# Patient Record
Sex: Female | Born: 1985
Health system: Southern US, Community
[De-identification: ages and names within clinical notes are randomized; demographics above are authoritative.]

## PROBLEM LIST (undated history)

## (undated) ENCOUNTER — Inpatient Hospital Stay (HOSPITAL_COMMUNITY): Payer: Self-pay

## (undated) DIAGNOSIS — E669 Obesity, unspecified: Secondary | ICD-10-CM

## (undated) DIAGNOSIS — Z789 Other specified health status: Secondary | ICD-10-CM

## (undated) DIAGNOSIS — Z3490 Encounter for supervision of normal pregnancy, unspecified, unspecified trimester: Secondary | ICD-10-CM

## (undated) DIAGNOSIS — G43909 Migraine, unspecified, not intractable, without status migrainosus: Secondary | ICD-10-CM

## (undated) HISTORY — PX: OTHER SURGICAL HISTORY: SHX169

## (undated) HISTORY — DX: Other specified health status: Z78.9

## (undated) HISTORY — DX: Encounter for supervision of normal pregnancy, unspecified, unspecified trimester: Z34.90

## (undated) HISTORY — PX: TONSILLECTOMY: SUR1361

## (undated) HISTORY — PX: CYST EXCISION: SHX5701

---
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
## 2007-01-15 ENCOUNTER — Emergency Department (HOSPITAL_COMMUNITY): Admission: EM | Admit: 2007-01-15 | Discharge: 2007-01-15 | Payer: Self-pay | Admitting: Emergency Medicine

## 2007-08-12 ENCOUNTER — Emergency Department (HOSPITAL_COMMUNITY): Admission: EM | Admit: 2007-08-12 | Discharge: 2007-08-12 | Payer: Self-pay | Admitting: Emergency Medicine

## 2009-05-14 ENCOUNTER — Emergency Department (HOSPITAL_COMMUNITY): Admission: EM | Admit: 2009-05-14 | Discharge: 2009-05-14 | Payer: Self-pay | Admitting: Emergency Medicine

## 2009-10-04 ENCOUNTER — Emergency Department (HOSPITAL_COMMUNITY): Admission: EM | Admit: 2009-10-04 | Discharge: 2009-10-04 | Payer: Self-pay | Admitting: Emergency Medicine

## 2010-05-30 ENCOUNTER — Emergency Department (HOSPITAL_COMMUNITY)
Admission: EM | Admit: 2010-05-30 | Discharge: 2010-05-31 | Disposition: A | Discharge status: Home / Self Care | Payer: PRIVATE HEALTH INSURANCE | Attending: Emergency Medicine | Admitting: Emergency Medicine

## 2010-05-30 DIAGNOSIS — R220 Localized swelling, mass and lump, head: Secondary | ICD-10-CM | POA: Insufficient documentation

## 2010-05-30 DIAGNOSIS — B370 Candidal stomatitis: Secondary | ICD-10-CM | POA: Insufficient documentation

## 2010-05-30 DIAGNOSIS — J029 Acute pharyngitis, unspecified: Secondary | ICD-10-CM | POA: Insufficient documentation

## 2010-05-30 DIAGNOSIS — K1370 Unspecified lesions of oral mucosa: Secondary | ICD-10-CM | POA: Insufficient documentation

## 2010-12-23 ENCOUNTER — Emergency Department (INDEPENDENT_AMBULATORY_CARE_PROVIDER_SITE_OTHER)
Admission: EM | Admit: 2010-12-23 | Discharge: 2010-12-23 | Disposition: A | Discharge status: Home / Self Care | Payer: PRIVATE HEALTH INSURANCE | Source: Home / Self Care

## 2010-12-23 DIAGNOSIS — B349 Viral infection, unspecified: Secondary | ICD-10-CM

## 2010-12-23 DIAGNOSIS — B9789 Other viral agents as the cause of diseases classified elsewhere: Secondary | ICD-10-CM

## 2010-12-23 MED ORDER — PROMETHAZINE-CODEINE 6.25-10 MG/5ML PO SYRP: ORAL_SOLUTION | ORAL | Status: AC

## 2010-12-23 NOTE — ED Provider Notes (Signed)
History     CSN: [id number] Arrival date & time: 12/23/2010  1:57 PM   None     Chief Complaint  Patient presents with  . Sore Throat    sorethroat, earache and cold symptoms for tow days    (Consider location/radiation/quality/duration/timing/severity/associated sxs/prior treatment) Patient is a 25 y.o. female presenting with pharyngitis and cough. The history is provided by the patient.  Sore Throat This is a new problem. The current episode started 2 days ago. The problem occurs constantly. The problem has not changed since onset.Associated symptoms include headaches. Pertinent negatives include no chest pain, no abdominal pain and no shortness of breath. The symptoms are aggravated by nothing. The symptoms are relieved by nothing.  Cough This is a new problem. The current episode started 2 days ago. The problem occurs every few minutes. The problem has not changed since onset.The cough is non-productive. The maximum temperature recorded prior to her arrival was 101 to 101.9 F. The fever has been present for 1 to 2 days. Associated symptoms include chills, headaches, rhinorrhea, sore throat and myalgias. Pertinent negatives include no chest pain, no ear pain, no shortness of breath and no wheezing. She has tried cough syrup for the symptoms. The treatment provided no relief. Her past medical history does not include asthma.    History reviewed. No pertinent past medical history.  Past Surgical History  Procedure Date  . Tonsillectomy     History reviewed. No pertinent family history.  History  Substance Use Topics  . Smoking status: Current Everyday Smoker -- 0.5 packs/day    Types: Cigarettes  . Smokeless tobacco: Not on file  . Alcohol Use: No    OB History    Grav Para Term Preterm Abortions TAB SAB Ect Mult Living                  Review of Systems  Constitutional: Positive for fever and chills. Negative for fatigue.  HENT: Positive for sore throat and  rhinorrhea. Negative for ear pain, sneezing, postnasal drip and sinus pressure.   Respiratory: Positive for cough. Negative for shortness of breath and wheezing.   Cardiovascular: Negative for chest pain and palpitations.  Gastrointestinal: Negative for abdominal pain.  Musculoskeletal: Positive for myalgias.  Neurological: Positive for headaches.    Allergies  Penicillins  Home Medications   Current Outpatient Rx  Name Route Sig Dispense Refill  . PROMETHAZINE-CODEINE 6.25-10 MG/5ML PO SYRP  1-2 tsp every 6 hrs prn cough 120 mL 0    BP 128/77  Pulse 95  Temp(Src) 98.5 F (36.9 C) (Oral)  Resp 18  SpO2 98%  LMP 12/15/2010  Physical Exam  Nursing note and vitals reviewed. Constitutional: She appears well-developed and well-nourished. No distress.  HENT:  Head: Normocephalic and atraumatic.  Right Ear: Tympanic membrane, external ear and ear canal normal.  Left Ear: Tympanic membrane, external ear and ear canal normal.  Nose: Nose normal.  Mouth/Throat: Uvula is midline, oropharynx is clear and moist and mucous membranes are normal. No oropharyngeal exudate, posterior oropharyngeal edema or posterior oropharyngeal erythema.  Neck: Neck supple.  Cardiovascular: Normal rate, regular rhythm and normal heart sounds.   Pulmonary/Chest: Effort normal and breath sounds normal. No respiratory distress.  Abdominal: Soft. She exhibits no distension and no mass. There is no tenderness.  Lymphadenopathy:    She has no cervical adenopathy.  Neurological: She is alert.  Skin: Skin is warm and dry.  Psychiatric: She has a normal mood and affect.  ED Course  Procedures (including critical care time)  Labs Reviewed - No data to display No results found.   1. Viral infection       MDM          Melody Comas, PA 12/23/10 1502

## 2010-12-29 NOTE — ED Provider Notes (Signed)
Medical screening examination/treatment/procedure(s) were performed by non-physician practitioner and as supervising physician I was immediately available for consultation/collaboration.  Luiz Blare MD   Luiz Blare, MD 12/29/10 [phone]

## 2012-07-22 LAB — OB RESULTS CONSOLE HIV ANTIBODY (ROUTINE TESTING): HIV: NONREACTIVE

## 2012-07-22 LAB — OB RESULTS CONSOLE RUBELLA ANTIBODY, IGM: Rubella: UNDETERMINED

## 2012-07-22 LAB — SICKLE CELL SCREEN: Sickle Cell Screen: NEGATIVE

## 2012-07-22 LAB — OB RESULTS CONSOLE ABO/RH: RH Type: POSITIVE

## 2012-07-22 LAB — OB RESULTS CONSOLE RPR: RPR: NONREACTIVE

## 2012-07-22 LAB — OB RESULTS CONSOLE GBS: GBS: NEGATIVE

## 2012-07-22 LAB — OB RESULTS CONSOLE PLATELET COUNT: Platelets: 347 10*3/uL

## 2012-09-25 LAB — CULTURE, OB URINE: Urine Culture, OB: 60000

## 2012-10-22 ENCOUNTER — Encounter: Payer: Self-pay | Admitting: *Deleted

## 2012-11-13 ENCOUNTER — Ambulatory Visit (INDEPENDENT_AMBULATORY_CARE_PROVIDER_SITE_OTHER): Payer: Medicaid Other | Admitting: Obstetrics & Gynecology

## 2012-11-13 ENCOUNTER — Encounter: Payer: Self-pay | Admitting: Obstetrics & Gynecology

## 2012-11-13 VITALS — BP 120/81 | Temp 97.0°F | Ht 70.0 in | Wt 285.6 lb

## 2012-11-13 DIAGNOSIS — Z3480 Encounter for supervision of other normal pregnancy, unspecified trimester: Secondary | ICD-10-CM

## 2012-11-13 DIAGNOSIS — O0990 Supervision of high risk pregnancy, unspecified, unspecified trimester: Secondary | ICD-10-CM | POA: Insufficient documentation

## 2012-11-13 DIAGNOSIS — Z3492 Encounter for supervision of normal pregnancy, unspecified, second trimester: Secondary | ICD-10-CM

## 2012-11-13 DIAGNOSIS — Z3490 Encounter for supervision of normal pregnancy, unspecified, unspecified trimester: Secondary | ICD-10-CM

## 2012-11-13 HISTORY — DX: Encounter for supervision of normal pregnancy, unspecified, unspecified trimester: Z34.90

## 2012-11-13 LAB — POCT URINALYSIS DIP (DEVICE)
Glucose, UA: NEGATIVE mg/dL
Nitrite: NEGATIVE
Protein, ur: NEGATIVE mg/dL
Urobilinogen, UA: 0.2 mg/dL (ref 0.0–1.0)

## 2012-11-13 NOTE — Progress Notes (Signed)
Transfer of care from [facility] (Dr. Rana Snare).  Will obtain records regarding labs, ultrasounds and pap smear.  No other complaints or concerns.  Fetal movement and labor precautions reviewed.  Third trimester labs and Tdap vaccine next visit.

## 2012-11-13 NOTE — Progress Notes (Signed)
P= 89 Pt. 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

## 2012-11-13 NOTE — Patient Instructions (Addendum)
Pregnancy - Third Trimester The third trimester of pregnancy (the last 3 months) is a period of the most rapid growth for you and your baby. The baby approaches a length of 20 inches and a weight of 6 to 10 pounds. The baby is adding on fat and getting ready for life outside your body. While inside, babies have periods of sleeping and waking, sucking thumbs, and hiccuping. You can often feel small contractions of the uterus. This is false labor. It is also called Braxton-Hicks contractions. This is like a practice for labor. The usual problems in this stage of pregnancy include more difficulty breathing, swelling of the hands and feet from water retention, and having to urinate more often because of the uterus and baby pressing on your bladder.  PRENATAL EXAMS  Blood work may continue to be done during prenatal exams. These tests are done to check on your health and the probable health of your baby. Blood work is used to follow your blood levels (hemoglobin). Anemia (low hemoglobin) is common during pregnancy. Iron and vitamins are given to help prevent this. You may also continue to be checked for diabetes. Some of the past blood tests may be done again.  The size of the uterus is measured during each visit. This makes sure your baby is growing properly according to your pregnancy dates.  Your blood pressure is checked every prenatal visit. This is to make sure you are not getting toxemia.  Your urine is checked every prenatal visit for infection, diabetes, and protein.  Your weight is checked at each visit. This is done to make sure gains are happening at the suggested rate and that you and your baby are growing normally.  Sometimes, an ultrasound is performed to confirm the position and the proper growth and development of the baby. This is a test done that bounces harmless sound waves off the baby so your caregiver can more accurately determine a due date.  Discuss the type of pain medicine and  anesthesia you will have during your labor and delivery.  Discuss the possibility and anesthesia if a cesarean section might be necessary.  Inform your caregiver if there is any mental or physical violence at home. Sometimes, a specialized non-stress test, contraction stress test, and biophysical profile are done to make sure the baby is not having a problem. Checking the amniotic fluid surrounding the baby is called an amniocentesis. The amniotic fluid is removed by sticking a needle into the belly (abdomen). This is sometimes done near the end of pregnancy if an early delivery is required. In this case, it is done to help make sure the baby's lungs are mature enough for the baby to live outside of the womb. If the lungs are not mature and it is unsafe to deliver the baby, an injection of cortisone medicine is given to the mother 1 to 2 days before the delivery. This helps the baby's lungs mature and makes it safer to deliver the baby. CHANGES OCCURING IN THE THIRD TRIMESTER OF PREGNANCY Your body goes through many changes during pregnancy. They vary from person to person. Talk to your caregiver about changes you notice and are concerned about.  During the last trimester, you have probably had an increase in your appetite. It is normal to have cravings for certain foods. This varies from person to person and pregnancy to pregnancy.  You may begin to get stretch marks on your hips, abdomen, and breasts. These are normal changes in the body   during pregnancy. There are no exercises or medicines to take which prevent this change.  Constipation may be treated with a stool softener or adding bulk to your diet. Drinking lots of fluids, fiber in vegetables, fruits, and whole grains are helpful.  Exercising is also helpful. If you have been very active up until your pregnancy, most of these activities can be continued during your pregnancy. If you have been less active, it is helpful to start an exercise  program such as walking. Consult your caregiver before starting exercise programs.  Avoid all smoking, alcohol, non-prescribed drugs, herbs and "street drugs" during your pregnancy. These chemicals affect the formation and growth of the baby. Avoid chemicals throughout the pregnancy to ensure the delivery of a healthy infant.  Backache, varicose veins, and hemorrhoids may develop or get worse.  You will tire more easily in the third trimester, which is normal.  The baby's movements may be stronger and more often.  You may become short of breath easily.  Your belly button may stick out.  A yellow discharge may leak from your breasts called colostrum.  You may have a bloody mucus discharge. This usually occurs a few days to a week before labor begins. HOME CARE INSTRUCTIONS   Keep your caregiver's appointments. Follow your caregiver's instructions regarding medicine use, exercise, and diet.  During pregnancy, you are providing food for you and your baby. Continue to eat regular, well-balanced meals. Choose foods such as meat, fish, milk and other low fat dairy products, vegetables, fruits, and whole-grain breads and cereals. Your caregiver will tell you of the ideal weight gain.  A physical sexual relationship may be continued throughout pregnancy if there are no other problems such as early (premature) leaking of amniotic fluid from the membranes, vaginal bleeding, or belly (abdominal) pain.  Exercise regularly if there are no restrictions. Check with your caregiver if you are unsure of the safety of your exercises. Greater weight gain will occur in the last 2 trimesters of pregnancy. Exercising helps:  Control your weight.  Get you in shape for labor and delivery.  You lose weight after you deliver.  Rest a lot with legs elevated, or as needed for leg cramps or low back pain.  Wear a good support or jogging bra for breast tenderness during pregnancy. This may help if worn during  sleep. Pads or tissues may be used in the bra if you are leaking colostrum.  Do not use hot tubs, steam rooms, or saunas.  Wear your seat belt when driving. This protects you and your baby if you are in an accident.  Avoid raw meat, cat litter boxes and soil used by cats. These carry germs that can cause birth defects in the baby.  It is easier to leak urine during pregnancy. Tightening up and strengthening the pelvic muscles will help with this problem. You can practice stopping your urination while you are going to the bathroom. These are the same muscles you need to strengthen. It is also the muscles you would use if you were trying to stop from passing gas. You can practice tightening these muscles up 10 times a set and repeating this about 3 times per day. Once you know what muscles to tighten up, do not perform these exercises during urination. It is more likely to cause an infection by backing up the urine.  Ask for help if you have financial, counseling, or nutritional needs during pregnancy. Your caregiver will be able to offer counseling for these   needs as well as refer you for other special needs.  Make a list of emergency phone numbers and have them available.  Plan on getting help from family or friends when you go home from the hospital.  Make a trial run to the hospital.  Take prenatal classes with the father to understand, practice, and ask questions about the labor and delivery.  Prepare the baby's room or nursery.  Do not travel out of the city unless it is absolutely necessary and with the advice of your caregiver.  Wear only low or no heal shoes to have better balance and prevent falling. MEDICINES AND DRUG USE IN PREGNANCY  Take prenatal vitamins as directed. The vitamin should contain 1 milligram of folic acid. Keep all vitamins out of reach of children. Only a couple vitamins or tablets containing iron may be fatal to a baby or young child when ingested.  Avoid use  of all medicines, including herbs, over-the-counter medicines, not prescribed or suggested by your caregiver. Only take over-the-counter or prescription medicines for pain, discomfort, or fever as directed by your caregiver. Do not use aspirin, ibuprofen or naproxen unless approved by your caregiver.  Let your caregiver also know about herbs you may be using.  Alcohol is related to a number of birth defects. This includes fetal alcohol syndrome. All alcohol, in any form, should be avoided completely. Smoking will cause low birth rate and premature babies.  Illegal drugs are very harmful to the baby. They are absolutely forbidden. A baby born to an addicted mother will be addicted at birth. The baby will go through the same withdrawal an adult does. SEEK MEDICAL CARE IF: You have any concerns or worries during your pregnancy. It is better to call with your questions if you feel they cannot wait, rather than worry about them. SEEK IMMEDIATE MEDICAL CARE IF:   An unexplained oral temperature above 102 F (38.9 C) develops, or as your caregiver suggests.  You have leaking of fluid from the vagina. If leaking membranes are suspected, take your temperature and tell your caregiver of this when you call.  There is vaginal spotting, bleeding or passing clots. Tell your caregiver of the amount and how many pads are used.  You develop a bad smelling vaginal discharge with a change in the color from clear to white.  You develop vomiting that lasts more than 24 hours.  You develop chills or fever.  You develop shortness of breath.  You develop burning on urination.  You loose more than 2 pounds of weight or gain more than 2 pounds of weight or as suggested by your caregiver.  You notice sudden swelling of your face, hands, and feet or legs.  You develop belly (abdominal) pain. Round ligament discomfort is a common non-cancerous (benign) cause of abdominal pain in pregnancy. Your caregiver still  must evaluate you.  You develop a severe headache that does not go away.  You develop visual problems, blurred or double vision.  If you have not felt your baby move for more than 1 hour. If you think the baby is not moving as much as usual, eat something with sugar in it and lie down on your left side for an hour. The baby should move at least 4 to 5 times per hour. Call right away if your baby moves less than that.  You fall, are in a car accident, or any kind of trauma.  There is mental or physical violence at home. Document Released: 01/02/2001   Document Revised: 10/03/2011 Document Reviewed: 07/07/2008 [employer] Patient Information 2014 [employer].  Breastfeeding A change in hormones during your pregnancy causes growth of your breast tissue and an increase in number and size of milk ducts. The hormone prolactin allows proteins, sugars, and fats from your blood supply to make breast milk in your milk-producing glands. The hormone progesterone prevents breast milk from being released before the birth of your baby. After the birth of your baby, your progesterone level decreases allowing breast milk to be released. Thoughts of your baby, as well as his or her sucking or crying, can stimulate the release of milk from the milk-producing glands. Deciding to breastfeed (nurse) is one of the best choices you can make for you and your baby. The information that follows gives a brief review of the benefits, as well as other important skills to know about breastfeeding. BENEFITS OF BREASTFEEDING For your baby  The first milk (colostrum) helps your baby's digestive system function better.   There are antibodies in your milk that help your baby fight off infections.   Your baby has a lower incidence of asthma, allergies, and sudden infant death syndrome (SIDS).   The nutrients in breast milk are better for your baby than infant formulas.  Breast milk improves your baby's brain development.    Your baby will have less gas, colic, and constipation.  Your baby is less likely to develop other conditions, such as childhood obesity, asthma, or diabetes mellitus. For you  Breastfeeding helps develop a very special bond between you and your baby.   Breastfeeding is convenient, always available at the correct temperature, and costs nothing.   Breastfeeding helps to burn calories and helps you lose the weight gained during pregnancy.   Breastfeeding makes your uterus contract back down to normal size faster and slows bleeding following delivery.   Breastfeeding mothers have a lower risk of developing osteoporosis or breast or ovarian cancer later in life.  BREASTFEEDING FREQUENCY  A healthy, full-term baby may breastfeed as often as every hour or space his or her feedings to every 3 hours. Breastfeeding frequency will vary from baby to baby.   Newborns should be fed no less than every 2 3 hours during the day and every 4 5 hours during the night. You should breastfeed a minimum of 8 feedings in a 24 hour period.  Awaken your baby to breastfeed if it has been 3 4 hours since the last feeding.  Breastfeed when you feel the need to reduce the fullness of your breasts or when your newborn shows signs of hunger. Signs that your baby may be hungry include:  Increased alertness or activity.  Stretching.  Movement of the head from side to side.  Movement of the head and opening of the mouth when the corner of the mouth or cheek is stroked (rooting).  Increased sucking sounds, smacking lips, cooing, sighing, or squeaking.  Hand-to-mouth movements.  Increased sucking of fingers or hands.  Fussing.  Intermittent crying.  Signs of extreme hunger will require calming and consoling before you try to feed your baby. Signs of extreme hunger may include:  Restlessness.  A loud, strong cry.  Screaming.  Frequent feeding will help you make more milk and will help prevent  problems, such as sore nipples and engorgement of the breasts.  BREASTFEEDING   Whether lying down or sitting, be sure that the baby's abdomen is facing your abdomen.   Support your breast with 4 fingers under your breast   and your thumb above your nipple. Make sure your fingers are well away from your nipple and your baby's mouth.   Stroke your baby's lips gently with your finger or nipple.   When your baby's mouth is open wide enough, place all of your nipple and as much of the colored area around your nipple (areola) as possible into your baby's mouth.  More areola should be visible above his or her upper lip than below his or her lower lip.  Your baby's tongue should be between his or her lower gum and your breast.  Ensure that your baby's mouth is correctly positioned around the nipple (latched). Your baby's lips should create a seal on your breast.  Signs that your baby has effectively latched onto your nipple include:  Tugging or sucking without pain.  Swallowing heard between sucks.  Absent click or smacking sound.  Muscle movement above and in front of his or her ears with sucking.  Your baby must suck about 2 3 minutes in order to get your milk. Allow your baby to feed on each breast as long as he or she wants. Nurse your baby until he or she unlatches or falls asleep at the first breast, then offer the second breast.  Signs that your baby is full and satisfied include:  A gradual decrease in the number of sucks or complete cessation of sucking.  Falling asleep.  Extension or relaxation of his or her body.  Retention of a small amount of milk in his or her mouth.  Letting go of your breast by himself or herself.  Signs of effective breastfeeding in you include:  Breasts that have increased firmness, weight, and size prior to feeding.  Breasts that are softer after nursing.  Increased milk volume, as well as a change in milk consistency and color by the 5th  day of breastfeeding.  Breast fullness relieved by breastfeeding.  Nipples are not sore, cracked, or bleeding.  If needed, break the suction by putting your finger into the corner of your baby's mouth and sliding your finger between his or her gums. Then, remove your breast from his or her mouth.  It is common for babies to spit up a small amount after a feeding.  Babies often swallow air during feeding. This can make babies fussy. Burping your baby between breasts can help with this.  Vitamin D supplements are recommended for babies who get only breast milk.  Avoid using a pacifier during your baby's first 4 6 weeks.  Avoid supplemental feedings of water, formula, or juice in place of breastfeeding. Breast milk is all the food your baby needs. It is not necessary for your baby to have water or formula. Your breasts will make more milk if supplemental feedings are avoided during the early weeks. HOW TO TELL WHETHER YOUR BABY IS GETTING ENOUGH BREAST MILK Wondering whether or not your baby is getting enough milk is a common concern among mothers. You can be assured that your baby is getting enough milk if:   Your baby is actively sucking and you hear swallowing.   Your baby seems relaxed and satisfied after a feeding.   Your baby nurses at least 8 12 times in a 24 hour time period.  During the first 3 5 days of age:  Your baby is wetting at least 3 5 diapers in a 24 hour period. The urine should be clear and pale yellow.  Your baby is having at least 3 4 stools in   a 24 hour period. The stool should be soft and yellow.  At 5 7 days of age, your baby is having at least 3 6 stools in a 24 hour period. The stool should be seedy and yellow by 5 days of age.  Your baby has a weight loss less than 7 10% during the first 3 days of age.  Your baby does not lose weight after 3 7 days of age.  Your baby gains 4 7 ounces each week after he or she is 4 days of age.  Your baby gains weight  by 5 days of age and is back to birth weight within 2 weeks. ENGORGEMENT In the first week after your baby is born, you may experience extremely full breasts (engorgement). When engorged, your breasts may feel heavy, warm, or tender to the touch. Engorgement peaks within 24 48 hours after delivery of your baby.  Engorgement may be reduced by:  Continuing to breastfeed.  Increasing the frequency of breastfeeding.  Taking warm showers or applying warm, moist heat to your breasts just before each feeding. This increases circulation and helps the milk flow.   Gently massaging your breast before and during the feedings. With your fingertips, massage from your chest wall towards your nipple in a circular motion.   Ensuring that your baby empties at least one breast at every feeding. It also helps to start the next feeding on the opposite breast.   Expressing breast milk by hand or by using a breast pump to empty the breasts if your baby is sleepy, or not nursing well. You may also want to express milk if you are returning to work oryou feel you are getting engorged.  Ensuring your baby is latched on and positioned properly while breastfeeding. If you follow these suggestions, your engorgement should improve in 24 48 hours. If you are still experiencing difficulty, call your lactation consultant or caregiver.  CARING FOR YOURSELF Take care of your breasts.  Bathe or shower daily.   Avoid using soap on your nipples.   Wear a supportive bra. Avoid wearing underwire style bras.  Air dry your nipples for a 3 4minutes after each feeding.   Use only cotton bra pads to absorb breast milk leakage. Leaking of breast milk between feedings is normal.   Use only pure lanolin on your nipples after nursing. You do not need to wash it off before feeding your baby again. Another option is to express a few drops of breast milk and gently massage that milk into your nipples.  Continue breast  self-awareness checks. Take care of yourself.  Eat healthy foods. Alternate 3 meals with 3 snacks.  Avoid foods that you notice affect your baby in a bad way.  Drink milk, fruit juice, and water to satisfy your thirst (about 8 glasses a day).   Rest often, relax, and take your prenatal vitamins to prevent fatigue, stress, and anemia.  Avoid chewing and smoking tobacco.  Avoid alcohol and drug use.  Take over-the-counter and prescribed medicine only as directed by your caregiver or pharmacist. You should always check with your caregiver or pharmacist before taking any new medicine, vitamin, or herbal supplement.  Know that pregnancy is possible while breastfeeding. If desired, talk to your caregiver about family planning and safe birth control methods that may be used while breastfeeding. SEEK MEDICAL CARE IF:   You feel like you want to stop breastfeeding or have become frustrated with breastfeeding.  You have painful breasts or nipples.    Your nipples are cracked or bleeding.  Your breasts are red, tender, or warm.  You have a swollen area on either breast.  You have a fever or chills.  You have nausea or vomiting.  You have drainage from your nipples.  Your breasts do not become full before feedings by the 5th day after delivery.  You feel sad and depressed.  Your baby is too sleepy to eat well.  Your baby is having trouble sleeping.   Your baby is wetting less than 3 diapers in a 24 hour period.  Your baby has less than 3 stools in a 24 hour period.  Your baby's skin or the white part of his or her eyes becomes more yellow.   Your baby is not gaining weight by 54 days of age. MAKE SURE YOU:   Understand these instructions.  Will watch your condition.  Will get help right away if you are not doing well or get worse. Document Released: 01/08/2005 Document Revised: 10/03/2011 Document Reviewed: 08/15/2011 [facility] Patient Information 2014 [place],  [place]. Tetanus, Diphtheria (Td) or Tetanus, Diphtheria, Pertussis (Tdap) Vaccine What You Need to Know WHY GET VACCINATED? Tetanus, diphtheria and pertussis can be very serious diseases. TETANUS (Lockjaw) causes painful muscle spasms and stiffness, usually all over the body.  Tetanus can lead to tightening of muscles in the head and neck so the victim cannot open his mouth or swallow, or sometimes even breathe. Tetanus kills about 1 out of 5 people who are infected. DIPHTHERIA can cause a thick membrane to cover the back of the throat.  Diphtheria can lead to breathing problems, paralysis, heart failure, and even death. PERTUSSIS (Whooping Cough) causes severe coughing spells which can lead to difficulty breathing, vomiting, and disturbed sleep.  Pertussis can lead to weight loss, incontinence, rib fractures and passing out from violent coughing. Up to 2 in 100 adolescents and 5 in 100 adults with pertussis are hospitalized or have complications, including pneumonia and death. These 3 diseases are all caused by bacteria. Diphtheria and pertussis are spread from person to person. Tetanus enters the body through cuts, scratches, or wounds. The [place] saw as many as 200,000 cases a year of diphtheria and pertussis before vaccines were available, and hundreds of cases of tetanus. Since then, tetanus and diphtheria cases have dropped by about 99% and pertussis cases by about 92%. Children 58 years of age and younger get DTaP vaccine to protect them from these three diseases. But older children, adolescents, and adults need protection too. VACCINES FOR ADOLESCENTS AND ADULTS: TD AND TDAP Two vaccines are available to protect people 19 years of age and older from these diseases:  Td vaccine has been used for many years. It protects against tetanus and diphtheria.  Tdap vaccine was licensed in 2005. It is the first vaccine for adolescents and adults that protects against pertussis as well as tetanus  and diphtheria. A Td booster dose is recommended every 10 years. Tdap is given only once. WHICH VACCINE, AND WHEN? Ages 7 through 18 years  A dose of Tdap is recommended at age 61 or 20. This dose could be given as early as age 55 for children who missed one or more childhood doses of DTaP.  Children and adolescents who did not get a complete series of DTaP shots by age 41 should complete the series using a combination of Td and Tdap. Age 89 years and Older  All adults should get a booster dose of Td every 10 years.  Adults under 65 who have never gotten Tdap should get a dose of Tdap as their next booster dose. Adults 65 and older may get one booster dose of Tdap.  Adults (including women who may become pregnant and adults 38 and older) who expect to have close contact with a baby younger than 2 months of age should get a dose of Tdap to help protect the baby from pertussis.  Healthcare professionals who have direct patient contact in hospitals or clinics should get one dose of Tdap. Protection After a Wound  A person who gets a severe cut or burn might need a dose of Td or Tdap to prevent tetanus infection. Tdap should be used for anyone who has never had a dose previously. Td should be used if Tdap is not available, or for:  Anybody who has already had a dose of Tdap.  Children 7 through 49 years of age who completed the childhood DTaP series.  Adults 65 and older. Pregnant Women  Pregnant women who have never had a dose of Tdap should get one, after the 20th week of gestation and preferably during the 3rd trimester. If they do not get Tdap during their pregnancy they should get a dose as soon as possible after delivery. Pregnant women who have previously received Tdap and need tetanus or diphtheria vaccine while pregnant should get Td. Tdap and Td may be given at the same time as other vaccines. SOME PEOPLE SHOULD NOT BE VACCINATED OR SHOULD WAIT  Anyone who has had a life-threatening  allergic reaction after a dose of any tetanus, diphtheria, or pertussis containing vaccine should not get Td or Tdap.  Anyone who has a severe allergy to any component of a vaccine should not get that vaccine. Tell your doctor if the person getting the vaccine has any severe allergies.  Anyone who had a coma, or long or multiple seizures within 7 days after a dose of DTP or DTaP should not get Tdap, unless a cause other than the vaccine was found. These people may get Td.  Talk to your doctor if the person getting either vaccine:  Has epilepsy or another nervous system problem.  Had severe swelling or severe pain after a previous dose of DTP, DTaP, DT, Td, or Tdap vaccine.  Has had Guillain Barr Syndrome (GBS). Anyone who has a moderate or severe illness on the day the shot is scheduled should usually wait until they recover before getting Tdap or Td vaccine. A person with a mild illness or low fever can usually be vaccinated. WHAT ARE THE RISKS FROM TDAP AND TD VACCINES? With a vaccine, as with any medicine, there is always a small risk of a life-threatening allergic reaction or other serious problem. Brief fainting spells and related symptoms (such as jerking movements) can happen after any medical procedure, including vaccination. Sitting or lying down for about 15 minutes after a vaccination can help prevent fainting and injuries caused by falls. Tell your doctor if the patient feels dizzy or lightheaded, or has vision changes or ringing in the ears. Getting tetanus, diphtheria, or pertussis would be much more likely to lead to severe problems than getting either Td or Tdap vaccine. Problems reported after Td and Tdap vaccines are listed below. Mild Problems (noticeable, but did not interfere with activities) Tdap  Pain (about 3 in 4 adolescents and 2 in 3 adults).  Redness or swelling (about 1 in 5).  Mild fever of at least 100.4 F (38 C) (up to  about 1 in 25 adolescents and 1 in  100 adults).  Headache (about 4 in 10 adolescents and 3 in 10 adults).  Tiredness (about 1 in 3 adolescents and 1 in 4 adults).  Nausea, vomiting, diarrhea, or stomach ache (up to 1 in 4 adolescents and 1 in 10 adults).  Chills, body aches, sore joints, rash, or swollen glands (uncommon). Td  Pain (up to about 8 in 10).  Redness or swelling at the injection site (up to about 1 in 3).  Mild fever (up to about 1 in 15).  Headache or tiredness (uncommon). Moderate Problems (interfered with activities, but did not require medical attention) Tdap  Pain at the injection site (about 1 in 20 adolescents and 1 in 100 adults).  Redness or swelling at the injection site (up to about 1 in 16 adolescents and 1 in 25 adults).  Fever over 102 F (38.9 C) (about 1 in 100 adolescents and 1 in 250 adults).  Headache (1 in 300).  Nausea, vomiting, diarrhea, or stomach ache (up to 3 in 100 adolescents and 1 in 100 adults). Td  Fever over 102 F (38.9 C) (rare). Tdap or Td  Extensive swelling of the arm where the shot was given (up to about 3 in 100). Severe Problems (Unable to perform usual activities; required medical attention) Tdap or Td  Swelling, severe pain, bleeding, and redness in the arm where the shot was given (rare). A severe allergic reaction could occur after any vaccine. They are estimated to occur less than once in a million doses. WHAT IF THERE IS A SEVERE REACTION? What should I look for? Any unusual condition, such as a severe allergic reaction or a high fever. If a severe allergic reaction occurred, it would be within a few minutes to an hour after the shot. Signs of a serious allergic reaction can include difficulty breathing, weakness, hoarseness or wheezing, a fast heartbeat, hives, dizziness, paleness, or swelling of the throat. What should I do?  Call a doctor, or get the person to a doctor right away.  Tell your doctor what happened, the date and time it  happened, and when the vaccination was given.  Ask your provider to report the reaction by filing a Vaccine Adverse Event Reporting System (VAERS) form. Or, you can file this report through the VAERS website at www.vaers.LAgents.no or by calling 1-6362988930. VAERS does not provide medical advice. THE NATIONAL VACCINE INJURY COMPENSATION PROGRAM The National Vaccine Injury Compensation Program (VICP) was created in 1986. Persons who believe they may have been injured by a vaccine can learn about the program and about filing a claim by calling 1-203-656-7868 or visiting the VICP website at SpiritualWord.at. HOW CAN I LEARN MORE?  Your doctor can give you the vaccine package insert or suggest other sources of information.  Call your local or state health department.  Contact the Centers for Disease Control and Prevention (CDC):  Call 618-407-8654 (1-800-CDC-INFO).  Visit the [facility] website at PicCapture.uy. CDC Td and Tdap Interim VIS (02/14/10) Document Released: 11/05/2005 Document Revised: 04/02/2011 Document Reviewed: 02/14/2010 ExitCare Patient Information 2014 [place], [place].

## 2012-11-13 NOTE — Addendum Note (Signed)
Addended by: Franchot Mimes on: 11/13/2012 02:39 PM   Modules accepted: Orders

## 2012-11-14 LAB — PRESCRIPTION MONITORING PROFILE (19 PANEL)
Amphetamine/Meth: NEGATIVE ng/mL
Buprenorphine, Urine: NEGATIVE ng/mL
Cannabinoid Scrn, Ur: NEGATIVE ng/mL
Meperidine, Ur: NEGATIVE ng/mL
Nitrites, Initial: NEGATIVE ug/mL
Phencyclidine, Ur: NEGATIVE ng/mL
Tramadol Scrn, Ur: NEGATIVE ng/mL
pH, Initial: 7.6 pH (ref 4.5–8.9)

## 2012-11-14 LAB — ALCOHOL METABOLITE (ETG), URINE: Ethyl Glucuronide (EtG): NEGATIVE ng/mL

## 2012-11-27 ENCOUNTER — Other Ambulatory Visit: Payer: Self-pay

## 2012-12-04 ENCOUNTER — Ambulatory Visit (INDEPENDENT_AMBULATORY_CARE_PROVIDER_SITE_OTHER): Payer: Medicaid Other | Admitting: Family

## 2012-12-04 VITALS — BP 125/89 | Temp 97.2°F | Wt 287.4 lb

## 2012-12-04 DIAGNOSIS — Z23 Encounter for immunization: Secondary | ICD-10-CM

## 2012-12-04 DIAGNOSIS — R8271 Bacteriuria: Secondary | ICD-10-CM

## 2012-12-04 DIAGNOSIS — Z3492 Encounter for supervision of normal pregnancy, unspecified, second trimester: Secondary | ICD-10-CM

## 2012-12-04 LAB — CBC
HCT: 33.1 % — ABNORMAL LOW (ref 36.0–46.0)
Platelets: 321 10*3/uL (ref 150–400)
WBC: 8.7 10*3/uL (ref 4.0–10.5)

## 2012-12-04 LAB — POCT URINALYSIS DIP (DEVICE)
Hgb urine dipstick: NEGATIVE
Ketones, ur: NEGATIVE mg/dL
Nitrite: NEGATIVE
pH: 7 (ref 5.0–8.0)

## 2012-12-04 MED ORDER — TETANUS-DIPHTH-ACELL PERTUSSIS 5-2.5-18.5 LF-MCG/0.5 IM SUSP: 0.5000 mL | Freq: Once | INTRAMUSCULAR | Status: DC

## 2012-12-04 NOTE — Addendum Note (Signed)
Addended by: Jill Side on: 12/04/2012 02:59 PM   Modules accepted: Orders

## 2012-12-04 NOTE — Progress Notes (Signed)
P=87,  c/o hip pain sometimes,

## 2012-12-04 NOTE — Progress Notes (Deleted)
R

## 2012-12-04 NOTE — Progress Notes (Signed)
U/S scheduled 12/09/12 at 330 pm.

## 2012-12-04 NOTE — Addendum Note (Signed)
Addended by: Franchot Mimes on: 12/04/2012 11:56 AM   Modules accepted: Orders

## 2012-12-05 LAB — GLUCOSE TOLERANCE, 1 HOUR (50G) W/O FASTING: Glucose, 1 Hour GTT: 136 mg/dL (ref 70–140)

## 2012-12-05 LAB — HIV ANTIBODY (ROUTINE TESTING W REFLEX): HIV: NONREACTIVE

## 2012-12-06 NOTE — Progress Notes (Signed)
Reports no questions or concerns.  No headaches, vision changes or epigastric pain.  Schedule anatomy ultrasound (has not received record from Dr. Vance Gather office).

## 2012-12-06 NOTE — Progress Notes (Signed)
28 wk labs obtained.

## 2012-12-08 LAB — CULTURE, OB URINE

## 2012-12-09 ENCOUNTER — Ambulatory Visit (HOSPITAL_COMMUNITY)
Admission: RE | Admit: 2012-12-09 | Discharge: 2012-12-09 | Disposition: A | Discharge status: Home / Self Care | Payer: Medicaid Other | Source: Ambulatory Visit | Attending: Family | Admitting: Family

## 2012-12-09 ENCOUNTER — Other Ambulatory Visit: Payer: Self-pay | Admitting: Family

## 2012-12-09 ENCOUNTER — Ambulatory Visit (HOSPITAL_COMMUNITY): Admission: RE | Admit: 2012-12-09 | Payer: Medicaid Other | Source: Ambulatory Visit

## 2012-12-09 ENCOUNTER — Encounter: Payer: Self-pay | Admitting: Family

## 2012-12-09 DIAGNOSIS — E669 Obesity, unspecified: Secondary | ICD-10-CM | POA: Insufficient documentation

## 2012-12-09 DIAGNOSIS — Z3492 Encounter for supervision of normal pregnancy, unspecified, second trimester: Secondary | ICD-10-CM

## 2012-12-09 DIAGNOSIS — Z3689 Encounter for other specified antenatal screening: Secondary | ICD-10-CM | POA: Insufficient documentation

## 2012-12-09 MED ORDER — NITROFURANTOIN MONOHYD MACRO 100 MG PO CAPS: 100.0000 mg | ORAL_CAPSULE | Freq: Two times a day (BID) | ORAL | Status: DC

## 2012-12-09 MED ORDER — ONDANSETRON 4 MG PO TBDP: 4.0000 mg | ORAL_TABLET | Freq: Four times a day (QID) | ORAL | Status: DC | PRN

## 2012-12-09 NOTE — Progress Notes (Signed)
RX sent to [facility] on [address] for Macrobid for UTI; pls notify pt when call is returned.

## 2012-12-10 ENCOUNTER — Encounter: Payer: Self-pay | Admitting: Family

## 2012-12-10 ENCOUNTER — Telehealth: Payer: Self-pay

## 2012-12-10 NOTE — Telephone Encounter (Signed)
Called pt and left message to return call to the clinics. Re: pt needs to schedule 3 hr and pt needs to be informed to pick Rx for Macrobid for UTI that [facility] phoned in.

## 2012-12-10 NOTE — Telephone Encounter (Signed)
Message copied by Faythe Casa on Wed Dec 10, 2012  8:28 AM ------      Message from: Melissa Noon      Created: Tue Dec 09, 2012  4:34 PM      Regarding: need 3 hr scheduled       Please call and schedule 3 hr GTT ------

## 2012-12-11 ENCOUNTER — Encounter: Payer: Self-pay | Admitting: *Deleted

## 2012-12-11 NOTE — Telephone Encounter (Signed)
Called patient and informed her of medication available for pickup. Patient stated she went by and got the medication yesterday. Informed patient of 1 hr results and need for 3 hour. Patient confirmed she could come in Monday 11/24 at 8am for her 3 hr. Reminded patient to come in fasting, nothing to eat or nothing to drink other than water after midnight. Patient verbalized understanding to all and had no further questions

## 2012-12-15 ENCOUNTER — Other Ambulatory Visit: Payer: Medicaid Other

## 2012-12-15 DIAGNOSIS — O99810 Abnormal glucose complicating pregnancy: Secondary | ICD-10-CM

## 2012-12-16 ENCOUNTER — Encounter: Payer: Self-pay | Admitting: Obstetrics & Gynecology

## 2012-12-16 LAB — GLUCOSE TOLERANCE, 3 HOURS
Glucose Tolerance, 2 hour: 142 mg/dL (ref 70–164)
Glucose, GTT - 3 Hour: 116 mg/dL (ref 70–144)

## 2012-12-17 ENCOUNTER — Encounter: Payer: Self-pay | Admitting: *Deleted

## 2012-12-17 DIAGNOSIS — Z3493 Encounter for supervision of normal pregnancy, unspecified, third trimester: Secondary | ICD-10-CM

## 2012-12-24 ENCOUNTER — Ambulatory Visit (INDEPENDENT_AMBULATORY_CARE_PROVIDER_SITE_OTHER): Payer: Medicaid Other | Admitting: Obstetrics and Gynecology

## 2012-12-24 VITALS — BP 142/87 | Wt 291.9 lb

## 2012-12-24 DIAGNOSIS — R030 Elevated blood-pressure reading, without diagnosis of hypertension: Secondary | ICD-10-CM

## 2012-12-24 DIAGNOSIS — Z3400 Encounter for supervision of normal first pregnancy, unspecified trimester: Secondary | ICD-10-CM

## 2012-12-24 DIAGNOSIS — IMO0001 Reserved for inherently not codable concepts without codable children: Secondary | ICD-10-CM

## 2012-12-24 DIAGNOSIS — Z3403 Encounter for supervision of normal first pregnancy, third trimester: Secondary | ICD-10-CM

## 2012-12-24 LAB — POCT URINALYSIS DIP (DEVICE)
Glucose, UA: NEGATIVE mg/dL
Ketones, ur: NEGATIVE mg/dL
Nitrite: NEGATIVE
Protein, ur: NEGATIVE mg/dL
Urobilinogen, UA: 0.2 mg/dL (ref 0.0–1.0)

## 2012-12-24 NOTE — Patient Instructions (Signed)

## 2012-12-24 NOTE — Progress Notes (Signed)
Pulse: 78

## 2012-12-24 NOTE — Progress Notes (Signed)
Took Macrobid for ASB. No preE sx. Recheck BP since borderline at 2 visits: 124/72. (Initially done on forearm. Recheck BP in 1 wk. PreE precautions.

## 2012-12-31 ENCOUNTER — Ambulatory Visit (INDEPENDENT_AMBULATORY_CARE_PROVIDER_SITE_OTHER): Payer: Medicaid Other | Admitting: *Deleted

## 2012-12-31 VITALS — BP 128/80 | HR 76

## 2012-12-31 DIAGNOSIS — Z0130 Encounter for examination of blood pressure without abnormal findings: Secondary | ICD-10-CM

## 2012-12-31 DIAGNOSIS — Z136 Encounter for screening for cardiovascular disorders: Secondary | ICD-10-CM

## 2012-12-31 NOTE — Progress Notes (Signed)
Pt checks blood pressure for work and states that it is running the same as today. I advised that she check it periodically at work and let us know if it is trending high. Patient agrees. She denies any headaches or visual disturbances. She will followup as scheduled on Tuesday 12/16.

## 2013-01-02 ENCOUNTER — Ambulatory Visit (HOSPITAL_COMMUNITY)
Admission: RE | Admit: 2013-01-02 | Discharge: 2013-01-02 | Disposition: A | Discharge status: Home / Self Care | Payer: Medicaid Other | Source: Ambulatory Visit | Attending: Obstetrics and Gynecology | Admitting: Obstetrics and Gynecology

## 2013-01-02 DIAGNOSIS — Z3403 Encounter for supervision of normal first pregnancy, third trimester: Secondary | ICD-10-CM

## 2013-01-02 DIAGNOSIS — Z3689 Encounter for other specified antenatal screening: Secondary | ICD-10-CM | POA: Insufficient documentation

## 2013-01-02 DIAGNOSIS — Z3493 Encounter for supervision of normal pregnancy, unspecified, third trimester: Secondary | ICD-10-CM

## 2013-01-06 ENCOUNTER — Ambulatory Visit (INDEPENDENT_AMBULATORY_CARE_PROVIDER_SITE_OTHER): Payer: Medicaid Other | Admitting: Family Medicine

## 2013-01-06 VITALS — BP 121/81 | Temp 97.8°F | Wt 293.0 lb

## 2013-01-06 DIAGNOSIS — Z3480 Encounter for supervision of other normal pregnancy, unspecified trimester: Secondary | ICD-10-CM

## 2013-01-06 DIAGNOSIS — Z3493 Encounter for supervision of normal pregnancy, unspecified, third trimester: Secondary | ICD-10-CM

## 2013-01-06 DIAGNOSIS — IMO0002 Reserved for concepts with insufficient information to code with codable children: Secondary | ICD-10-CM

## 2013-01-06 DIAGNOSIS — R6889 Other general symptoms and signs: Secondary | ICD-10-CM

## 2013-01-06 LAB — POCT URINALYSIS DIP (DEVICE)
Bilirubin Urine: NEGATIVE
Glucose, UA: NEGATIVE mg/dL
Hgb urine dipstick: NEGATIVE
Nitrite: NEGATIVE
Specific Gravity, Urine: 1.025 (ref 1.005–1.030)
Urobilinogen, UA: 0.2 mg/dL (ref 0.0–1.0)
pH: 7 (ref 5.0–8.0)

## 2013-01-06 LAB — OB RESULTS CONSOLE ABO/RH: RH Type: POSITIVE

## 2013-01-06 NOTE — Progress Notes (Signed)
P=85  Pt reports pain in lower abd intermittently.

## 2013-01-06 NOTE — Progress Notes (Signed)
No vb no ctx, no log, +FM  No complaints.  Julie Keller is a 27 y.o. G2P0010 at [redacted]w[redacted]d  here for ROB visit.  Discussed with Patient:  -Plans to breast feed.  All questions answered. -Continue prenatal vitamins. -Reviewed fetal kick counts Pt to perform daily at a time when the baby is active, lie laterally with both hands on belly in quiet room and count all movements (hiccups, shoulder rolls, obvious kicks, etc); pt is to report to clinic L&D for less than 10 movements felt in a one hour time period-pt told as soon as she counts 10 movements the count is complete.  - Routine precautions discussed (depression, infection s/s).   Patient provided with all pertinent phone numbers for emergencies. - RTC for any VB, regular, painful cramps/ctxs occurring at a rate of >2/10 min, fever (100.5 or higher), n/v/d, any pain that is unresolving or worsening, LOF, decreased fetal movement, CP, SOB, edema - RTC in 2 weeks for next appt.   Problems: Patient Active Problem List   Diagnosis Date Noted  . ASCUS with positive high risk HPV 01/06/2013  . Supervision of normal pregnancy 11/13/2012    To Do: 1.    [x ] Vaccines: Flu: recd Tdap: recd [ ]  BCM: mirena [ ]  Readiness: baby has a place to sleep, car seat, other baby necessities.  Edu: [x ] PTL precautions; [ ]  BF class; [ ]  childbirth class; [ ]   BF counseling;

## 2013-01-06 NOTE — Patient Instructions (Signed)
Third Trimester of Pregnancy  The third trimester is from week 29 through week 42, months 7 through 9. The third trimester is a time when the fetus is growing rapidly. At the end of the ninth month, the fetus is about 20 inches in length and weighs 6 10 pounds.   BODY CHANGES  Your body goes through many changes during pregnancy. The changes vary from woman to woman.    Your weight will continue to increase. You can expect to gain 25 35 pounds (11 16 kg) by the end of the pregnancy.   You may begin to get stretch marks on your hips, abdomen, and breasts.   You may urinate more often because the fetus is moving lower into your pelvis and pressing on your bladder.   You may develop or continue to have heartburn as a result of your pregnancy.   You may develop constipation because certain hormones are causing the muscles that push waste through your intestines to slow down.   You may develop hemorrhoids or swollen, bulging veins (varicose veins).   You may have pelvic pain because of the weight gain and pregnancy hormones relaxing your joints between the bones in your pelvis. Back aches may result from over exertion of the muscles supporting your posture.   Your breasts will continue to grow and be tender. A yellow discharge may leak from your breasts called colostrum.   Your belly button may stick out.   You may feel short of breath because of your expanding uterus.   You may notice the fetus "dropping," or moving lower in your abdomen.   You may have a bloody mucus discharge. This usually occurs a few days to a week before labor begins.   Your cervix becomes thin and soft (effaced) near your due date.  WHAT TO EXPECT AT YOUR PRENATAL EXAMS   You will have prenatal exams every 2 weeks until week 36. Then, you will have weekly prenatal exams. During a routine prenatal visit:   You will be weighed to make sure you and the fetus are growing normally.   Your blood pressure is taken.   Your abdomen will be  measured to track your baby's growth.   The fetal heartbeat will be listened to.   Any test results from the previous visit will be discussed.   You may have a cervical check near your due date to see if you have effaced.  At around 36 weeks, your caregiver will check your cervix. At the same time, your caregiver will also perform a test on the secretions of the vaginal tissue. This test is to determine if a type of bacteria, Group B streptococcus, is present. Your caregiver will explain this further.  Your caregiver may ask you:   What your birth plan is.   How you are feeling.   If you are feeling the baby move.   If you have had any abnormal symptoms, such as leaking fluid, bleeding, severe headaches, or abdominal cramping.   If you have any questions.  Other tests or screenings that may be performed during your third trimester include:   Blood tests that check for low iron levels (anemia).   Fetal testing to check the health, activity level, and growth of the fetus. Testing is done if you have certain medical conditions or if there are problems during the pregnancy.  FALSE LABOR  You may feel small, irregular contractions that eventually go away. These are called Braxton Hicks contractions, or   false labor. Contractions may last for hours, days, or even weeks before true labor sets in. If contractions come at regular intervals, intensify, or become painful, it is best to be seen by your caregiver.   SIGNS OF LABOR    Menstrual-like cramps.   Contractions that are 5 minutes apart or less.   Contractions that start on the top of the uterus and spread down to the lower abdomen and back.   A sense of increased pelvic pressure or back pain.   A watery or bloody mucus discharge that comes from the vagina.  If you have any of these signs before the 37th week of pregnancy, call your caregiver right away. You need to go to the hospital to get checked immediately.  HOME CARE INSTRUCTIONS    Avoid all  smoking, herbs, alcohol, and unprescribed drugs. These chemicals affect the formation and growth of the baby.   Follow your caregiver's instructions regarding medicine use. There are medicines that are either safe or unsafe to take during pregnancy.   Exercise only as directed by your caregiver. Experiencing uterine cramps is a good sign to stop exercising.   Continue to eat regular, healthy meals.   Wear a good support bra for breast tenderness.   Do not use hot tubs, steam rooms, or saunas.   Wear your seat belt at all times when driving.   Avoid raw meat, uncooked cheese, cat litter boxes, and soil used by cats. These carry germs that can cause birth defects in the baby.   Take your prenatal vitamins.   Try taking a stool softener (if your caregiver approves) if you develop constipation. Eat more high-fiber foods, such as fresh vegetables or fruit and whole grains. Drink plenty of fluids to keep your urine clear or pale yellow.   Take warm sitz baths to soothe any pain or discomfort caused by hemorrhoids. Use hemorrhoid cream if your caregiver approves.   If you develop varicose veins, wear support hose. Elevate your feet for 15 minutes, 3 4 times a day. Limit salt in your diet.   Avoid heavy lifting, wear low heal shoes, and practice good posture.   Rest a lot with your legs elevated if you have leg cramps or low back pain.   Visit your dentist if you have not gone during your pregnancy. Use a soft toothbrush to brush your teeth and be gentle when you floss.   A sexual relationship may be continued unless your caregiver directs you otherwise.   Do not travel far distances unless it is absolutely necessary and only with the approval of your caregiver.   Take prenatal classes to understand, practice, and ask questions about the labor and delivery.   Make a trial run to the hospital.   Pack your hospital bag.   Prepare the baby's nursery.   Continue to go to all your prenatal visits as directed  by your caregiver.  SEEK MEDICAL CARE IF:   You are unsure if you are in labor or if your water has broken.   You have dizziness.   You have mild pelvic cramps, pelvic pressure, or nagging pain in your abdominal area.   You have persistent nausea, vomiting, or diarrhea.   You have a bad smelling vaginal discharge.   You have pain with urination.  SEEK IMMEDIATE MEDICAL CARE IF:    You have a fever.   You are leaking fluid from your vagina.   You have spotting or bleeding from your vagina.     You have severe abdominal cramping or pain.   You have rapid weight loss or gain.   You have shortness of breath with chest pain.   You notice sudden or extreme swelling of your face, hands, ankles, feet, or legs.   You have not felt your baby move in over an hour.   You have severe headaches that do not go away with medicine.   You have vision changes.  Document Released: 01/02/2001 Document Revised: 09/10/2012 Document Reviewed: 03/11/2012  ExitCare Patient Information 2014 [employer].

## 2013-01-20 ENCOUNTER — Ambulatory Visit (INDEPENDENT_AMBULATORY_CARE_PROVIDER_SITE_OTHER): Payer: Medicaid Other | Admitting: Family Medicine

## 2013-01-20 ENCOUNTER — Encounter: Payer: Self-pay | Admitting: Family Medicine

## 2013-01-20 VITALS — BP 138/82

## 2013-01-20 DIAGNOSIS — Z3493 Encounter for supervision of normal pregnancy, unspecified, third trimester: Secondary | ICD-10-CM

## 2013-01-20 DIAGNOSIS — Z3480 Encounter for supervision of other normal pregnancy, unspecified trimester: Secondary | ICD-10-CM

## 2013-01-20 LAB — POCT URINALYSIS DIP (DEVICE)
Glucose, UA: NEGATIVE mg/dL
Specific Gravity, Urine: 1.025 (ref 1.005–1.030)
Urobilinogen, UA: 0.2 mg/dL (ref 0.0–1.0)

## 2013-01-20 NOTE — Progress Notes (Signed)
No headache, vision changes, no RUQ pain +FM, no lof, no vb, no ctx  Julie Keller is a 27 y.o. G2P0010 at [redacted]w[redacted]d here for ROB visit.  Discussed with Patient:  -Plans to breast feed.  All questions answered. -Continue prenatal vitamins. -Reviewed fetal kick counts Pt to perform daily at a time when the baby is active, lie laterally with both hands on belly in quiet room and count all movements (hiccups, shoulder rolls, obvious kicks, etc); pt is to report to clinic L&D for less than 10 movements felt in a one hour time period-pt told as soon as she counts 10 movements the count is complete.  - Routine precautions discussed (depression, infection s/s).   Patient provided with all pertinent phone numbers for emergencies. - RTC for any VB, regular, painful cramps/ctxs occurring at a rate of >2/10 min, fever (100.5 or higher), n/v/d, any pain that is unresolving or worsening, LOF, decreased fetal movement, CP, SOB, edema - RTC in 2 weeks for next appt.   Problems: Patient Active Problem List   Diagnosis Date Noted  . ASCUS with positive high risk HPV 01/06/2013  . Supervision of normal pregnancy 11/13/2012    To Do: 1. GBS at next visit  [ ]  Vaccines: Flu: rec'd  Tdap:  [x ] BCM: mirena  [ ]  Readiness: baby has a place to sleep, car seat, other baby necessities.  Edu: [ ]  PTL precautions; [ ]  BF class; [ ]  childbirth class; [ ]   BF counseling;

## 2013-01-20 NOTE — Progress Notes (Signed)
P= 90 Edema in hands and feet "sometimes."

## 2013-01-20 NOTE — Patient Instructions (Signed)
Third Trimester of Pregnancy  The third trimester is from week 29 through week 42, months 7 through 9. The third trimester is a time when the fetus is growing rapidly. At the end of the ninth month, the fetus is about 20 inches in length and weighs 6 10 pounds.   BODY CHANGES  Your body goes through many changes during pregnancy. The changes vary from woman to woman.    Your weight will continue to increase. You can expect to gain 25 35 pounds (11 16 kg) by the end of the pregnancy.   You may begin to get stretch marks on your hips, abdomen, and breasts.   You may urinate more often because the fetus is moving lower into your pelvis and pressing on your bladder.   You may develop or continue to have heartburn as a result of your pregnancy.   You may develop constipation because certain hormones are causing the muscles that push waste through your intestines to slow down.   You may develop hemorrhoids or swollen, bulging veins (varicose veins).   You may have pelvic pain because of the weight gain and pregnancy hormones relaxing your joints between the bones in your pelvis. Back aches may result from over exertion of the muscles supporting your posture.   Your breasts will continue to grow and be tender. A yellow discharge may leak from your breasts called colostrum.   Your belly button may stick out.   You may feel short of breath because of your expanding uterus.   You may notice the fetus "dropping," or moving lower in your abdomen.   You may have a bloody mucus discharge. This usually occurs a few days to a week before labor begins.   Your cervix becomes thin and soft (effaced) near your due date.  WHAT TO EXPECT AT YOUR PRENATAL EXAMS   You will have prenatal exams every 2 weeks until week 36. Then, you will have weekly prenatal exams. During a routine prenatal visit:   You will be weighed to make sure you and the fetus are growing normally.   Your blood pressure is taken.   Your abdomen will be  measured to track your baby's growth.   The fetal heartbeat will be listened to.   Any test results from the previous visit will be discussed.   You may have a cervical check near your due date to see if you have effaced.  At around 36 weeks, your caregiver will check your cervix. At the same time, your caregiver will also perform a test on the secretions of the vaginal tissue. This test is to determine if a type of bacteria, Group B streptococcus, is present. Your caregiver will explain this further.  Your caregiver may ask you:   What your birth plan is.   How you are feeling.   If you are feeling the baby move.   If you have had any abnormal symptoms, such as leaking fluid, bleeding, severe headaches, or abdominal cramping.   If you have any questions.  Other tests or screenings that may be performed during your third trimester include:   Blood tests that check for low iron levels (anemia).   Fetal testing to check the health, activity level, and growth of the fetus. Testing is done if you have certain medical conditions or if there are problems during the pregnancy.  FALSE LABOR  You may feel small, irregular contractions that eventually go away. These are called Braxton Hicks contractions, or   false labor. Contractions may last for hours, days, or even weeks before true labor sets in. If contractions come at regular intervals, intensify, or become painful, it is best to be seen by your caregiver.   SIGNS OF LABOR    Menstrual-like cramps.   Contractions that are 5 minutes apart or less.   Contractions that start on the top of the uterus and spread down to the lower abdomen and back.   A sense of increased pelvic pressure or back pain.   A watery or bloody mucus discharge that comes from the vagina.  If you have any of these signs before the 37th week of pregnancy, call your caregiver right away. You need to go to the hospital to get checked immediately.  HOME CARE INSTRUCTIONS    Avoid all  smoking, herbs, alcohol, and unprescribed drugs. These chemicals affect the formation and growth of the baby.   Follow your caregiver's instructions regarding medicine use. There are medicines that are either safe or unsafe to take during pregnancy.   Exercise only as directed by your caregiver. Experiencing uterine cramps is a good sign to stop exercising.   Continue to eat regular, healthy meals.   Wear a good support bra for breast tenderness.   Do not use hot tubs, steam rooms, or saunas.   Wear your seat belt at all times when driving.   Avoid raw meat, uncooked cheese, cat litter boxes, and soil used by cats. These carry germs that can cause birth defects in the baby.   Take your prenatal vitamins.   Try taking a stool softener (if your caregiver approves) if you develop constipation. Eat more high-fiber foods, such as fresh vegetables or fruit and whole grains. Drink plenty of fluids to keep your urine clear or pale yellow.   Take warm sitz baths to soothe any pain or discomfort caused by hemorrhoids. Use hemorrhoid cream if your caregiver approves.   If you develop varicose veins, wear support hose. Elevate your feet for 15 minutes, 3 4 times a day. Limit salt in your diet.   Avoid heavy lifting, wear low heal shoes, and practice good posture.   Rest a lot with your legs elevated if you have leg cramps or low back pain.   Visit your dentist if you have not gone during your pregnancy. Use a soft toothbrush to brush your teeth and be gentle when you floss.   A sexual relationship may be continued unless your caregiver directs you otherwise.   Do not travel far distances unless it is absolutely necessary and only with the approval of your caregiver.   Take prenatal classes to understand, practice, and ask questions about the labor and delivery.   Make a trial run to the hospital.   Pack your hospital bag.   Prepare the baby's nursery.   Continue to go to all your prenatal visits as directed  by your caregiver.  SEEK MEDICAL CARE IF:   You are unsure if you are in labor or if your water has broken.   You have dizziness.   You have mild pelvic cramps, pelvic pressure, or nagging pain in your abdominal area.   You have persistent nausea, vomiting, or diarrhea.   You have a bad smelling vaginal discharge.   You have pain with urination.  SEEK IMMEDIATE MEDICAL CARE IF:    You have a fever.   You are leaking fluid from your vagina.   You have spotting or bleeding from your vagina.     You have severe abdominal cramping or pain.   You have rapid weight loss or gain.   You have shortness of breath with chest pain.   You notice sudden or extreme swelling of your face, hands, ankles, feet, or legs.   You have not felt your baby move in over an hour.   You have severe headaches that do not go away with medicine.   You have vision changes.  Document Released: 01/02/2001 Document Revised: 09/10/2012 Document Reviewed: 03/11/2012  ExitCare Patient Information 2014 [employer].

## 2013-01-22 NOTE — L&D Delivery Note (Signed)
Delivery Note At 8:17 PM a viable and healthy female was delivered via Vaginal, Spontaneous Delivery (Presentation: ; Occiput Anterior).  APGAR: 9, 9; weight 6 lb 8.1 oz (2950 g).   Placenta status: Intact, Spontaneous.  Cord: 3 vessels with the following complications: Short.   Anesthesia: Epidural  Episiotomy: no Lacerations: 2nd Suture Repair: 3.0 monocryl Est. Blood Loss (mL): 300  Mom to postpartum.  Baby to Couplet care / Skin to Skin.  [name] 02/04/2013, 9:18 PM  Pt delivered precipitously in the room with the resident. I arrived after baby was on skin to skin. I was present for delivery of the placenta and repair of the second degree tear as above. Dr. Emelda Fear was also present for the remainder of the delivery. No other complications. Mom and baby doing well.   Pinki Rottman, Redmond Baseman, MD

## 2013-01-22 NOTE — L&D Delivery Note (Signed)
`````  Attestation of Attending Supervision of Advanced Practitioner: Evaluation and management procedures were performed by the PA/NP/CNM/OB Fellow under my supervision/collaboration. Chart reviewed and agree with management and plan. I was present and actively participated in repair of second degree laceration, with was a straight midline laceration extending approx 5 cm up the post vag wall, repaired with 3.0 monocryl.  Tilda Burrow 02/04/2013 9:34 PM

## 2013-01-28 ENCOUNTER — Ambulatory Visit (INDEPENDENT_AMBULATORY_CARE_PROVIDER_SITE_OTHER): Payer: Medicaid Other | Admitting: Obstetrics & Gynecology

## 2013-01-28 ENCOUNTER — Encounter: Payer: Self-pay | Admitting: Obstetrics & Gynecology

## 2013-01-28 VITALS — BP 134/93 | Temp 97.9°F | Wt 295.3 lb

## 2013-01-28 DIAGNOSIS — O133 Gestational [pregnancy-induced] hypertension without significant proteinuria, third trimester: Secondary | ICD-10-CM

## 2013-01-28 DIAGNOSIS — Z3493 Encounter for supervision of normal pregnancy, unspecified, third trimester: Secondary | ICD-10-CM

## 2013-01-28 DIAGNOSIS — O139 Gestational [pregnancy-induced] hypertension without significant proteinuria, unspecified trimester: Secondary | ICD-10-CM

## 2013-01-28 LAB — POCT URINALYSIS DIP (DEVICE)
Bilirubin Urine: NEGATIVE
Glucose, UA: NEGATIVE mg/dL
Hgb urine dipstick: NEGATIVE
Ketones, ur: NEGATIVE mg/dL
Leukocytes, UA: NEGATIVE
Nitrite: NEGATIVE
PH: 6 (ref 5.0–8.0)
PROTEIN: 100 mg/dL — AB
Specific Gravity, Urine: >=1.03 (ref 1.005–1.030)
UROBILINOGEN UA: 0.2 mg/dL (ref 0.0–1.0)

## 2013-01-28 LAB — CBC
HEMATOCRIT: 33.4 % — AB (ref 36.0–46.0)
Hemoglobin: 11.3 g/dL — ABNORMAL LOW (ref 12.0–15.0)
MCH: 26.7 pg (ref 26.0–34.0)
MCHC: 33.8 g/dL (ref 30.0–36.0)
MCV: 78.8 fL (ref 78.0–100.0)
PLATELETS: 326 10*3/uL (ref 150–400)
RBC: 4.24 MIL/uL (ref 3.87–5.11)
RDW: 15 % (ref 11.5–15.5)
WBC: 10.5 10*3/uL (ref 4.0–10.5)

## 2013-01-28 LAB — OB RESULTS CONSOLE GC/CHLAMYDIA
CHLAMYDIA, DNA PROBE: NEGATIVE
GC PROBE AMP, GENITAL: NEGATIVE

## 2013-01-28 NOTE — Progress Notes (Signed)
Routine visit. Good FM. Cervical cultures obtained. Pre eclampsia precautions reviewed. NST- reactive,  DTRs-0 and equal. She denies HA, visual changes, RUQ pain. I will check labs and she prefers Friday for her return visit. She declines a cervical exam.

## 2013-01-28 NOTE — Progress Notes (Signed)
P = 66  Pt denies H/A or visual disturbances

## 2013-01-29 LAB — PROTEIN / CREATININE RATIO, URINE
Creatinine, Urine: 165.4 mg/dL
Protein Creatinine Ratio: 0.37 — ABNORMAL HIGH (ref <0.15)
Total Protein, Urine: 61 mg/dL

## 2013-01-29 LAB — COMPREHENSIVE METABOLIC PANEL
ALT: 10 U/L (ref 0–35)
AST: 11 U/L (ref 0–37)
Albumin: 3.1 g/dL — ABNORMAL LOW (ref 3.5–5.2)
Alkaline Phosphatase: 116 U/L (ref 39–117)
BILIRUBIN TOTAL: 0.2 mg/dL — AB (ref 0.3–1.2)
BUN: 8 mg/dL (ref 6–23)
CHLORIDE: 105 meq/L (ref 96–112)
CO2: 23 mEq/L (ref 19–32)
CREATININE: 0.52 mg/dL (ref 0.50–1.10)
Calcium: 8.8 mg/dL (ref 8.4–10.5)
Glucose, Bld: 69 mg/dL — ABNORMAL LOW (ref 70–99)
Potassium: 4.1 mEq/L (ref 3.5–5.3)
Sodium: 138 mEq/L (ref 135–145)
Total Protein: 5.6 g/dL — ABNORMAL LOW (ref 6.0–8.3)

## 2013-01-29 LAB — GC/CHLAMYDIA PROBE AMP
CT Probe RNA: NEGATIVE
GC Probe RNA: NEGATIVE

## 2013-01-30 ENCOUNTER — Ambulatory Visit (INDEPENDENT_AMBULATORY_CARE_PROVIDER_SITE_OTHER): Payer: Medicaid Other | Admitting: *Deleted

## 2013-01-30 VITALS — BP 130/88

## 2013-01-30 DIAGNOSIS — O133 Gestational [pregnancy-induced] hypertension without significant proteinuria, third trimester: Secondary | ICD-10-CM

## 2013-01-30 DIAGNOSIS — O139 Gestational [pregnancy-induced] hypertension without significant proteinuria, unspecified trimester: Secondary | ICD-10-CM

## 2013-01-30 DIAGNOSIS — O1400 Mild to moderate pre-eclampsia, unspecified trimester: Secondary | ICD-10-CM

## 2013-01-30 HISTORY — DX: Mild to moderate pre-eclampsia, unspecified trimester: O14.00

## 2013-01-30 LAB — POCT URINALYSIS DIP (DEVICE)
BILIRUBIN URINE: NEGATIVE
Glucose, UA: NEGATIVE mg/dL
HGB URINE DIPSTICK: NEGATIVE
Ketones, ur: NEGATIVE mg/dL
NITRITE: NEGATIVE
PH: 7 (ref 5.0–8.0)
Protein, ur: 100 mg/dL — AB
SPECIFIC GRAVITY, URINE: 1.025 (ref 1.005–1.030)
Urobilinogen, UA: 0.2 mg/dL (ref 0.0–1.0)

## 2013-01-30 LAB — CULTURE, BETA STREP (GROUP B ONLY)

## 2013-01-30 LAB — US OB FOLLOW UP

## 2013-01-30 NOTE — Progress Notes (Signed)
NST 01/30/13 reactive

## 2013-01-30 NOTE — Progress Notes (Signed)
P = 95    Pt denies H/A or visual disturbances

## 2013-01-31 ENCOUNTER — Encounter (HOSPITAL_COMMUNITY): Payer: Self-pay | Admitting: *Deleted

## 2013-01-31 ENCOUNTER — Inpatient Hospital Stay (HOSPITAL_COMMUNITY)
Admission: AD | Admit: 2013-01-31 | Discharge: 2013-01-31 | Disposition: A | Discharge status: Home / Self Care | Payer: Medicaid Other | Source: Ambulatory Visit | Attending: Obstetrics & Gynecology | Admitting: Obstetrics & Gynecology

## 2013-01-31 DIAGNOSIS — O139 Gestational [pregnancy-induced] hypertension without significant proteinuria, unspecified trimester: Secondary | ICD-10-CM

## 2013-01-31 DIAGNOSIS — G44209 Tension-type headache, unspecified, not intractable: Secondary | ICD-10-CM | POA: Insufficient documentation

## 2013-01-31 DIAGNOSIS — Z3493 Encounter for supervision of normal pregnancy, unspecified, third trimester: Secondary | ICD-10-CM

## 2013-01-31 DIAGNOSIS — O1493 Unspecified pre-eclampsia, third trimester: Secondary | ICD-10-CM

## 2013-01-31 DIAGNOSIS — Z87891 Personal history of nicotine dependence: Secondary | ICD-10-CM | POA: Insufficient documentation

## 2013-01-31 LAB — URINE MICROSCOPIC-ADD ON

## 2013-01-31 LAB — URINALYSIS, ROUTINE W REFLEX MICROSCOPIC
BILIRUBIN URINE: NEGATIVE
GLUCOSE, UA: NEGATIVE mg/dL
HGB URINE DIPSTICK: NEGATIVE
Ketones, ur: NEGATIVE mg/dL
Leukocytes, UA: NEGATIVE
Nitrite: NEGATIVE
Protein, ur: 100 mg/dL — AB
Specific Gravity, Urine: >1.03 — ABNORMAL HIGH (ref 1.005–1.030)
UROBILINOGEN UA: 0.2 mg/dL (ref 0.0–1.0)
pH: 6.5 (ref 5.0–8.0)

## 2013-01-31 MED ORDER — CYCLOBENZAPRINE HCL 10 MG PO TABS
10.0000 mg | ORAL_TABLET | ORAL | Status: AC
  Administered 2013-01-31: 10 mg via ORAL
  Filled 2013-01-31: qty 1

## 2013-01-31 NOTE — Discharge Instructions (Signed)
Hypertension During Pregnancy Hypertension is also called high blood pressure. It can occur at any time in life and during pregnancy. When you have hypertension, there is extra pressure inside your blood vessels that carry blood from the heart to the rest of your body (arteries). Hypertension during pregnancy can cause problems for you and your baby. Your baby might not weigh as much as it should at birth or might be born early (premature). Very bad cases of hypertension during pregnancy can be life threatening.  Different types of hypertension can occur during pregnancy.   Chronic hypertension. This happens when a woman has hypertension before pregnancy and it continues during pregnancy.  Gestational hypertension. This is when hypertension develops during pregnancy.  Preeclampsia or toxemia of pregnancy. This is a very serious type of hypertension that develops only during pregnancy. It is a disease that affects the whole body (systemic) and can be very dangerous for both mother and baby.  Gestational hypertension and preeclampsia usually go away after your baby is born. Blood pressure generally stabilizes within 6 weeks. Women who have hypertension during pregnancy have a greater chance of developing hypertension later in life or with future pregnancies. RISK FACTORS Some factors make you more likely to develop hypertension during pregnancy. Risk factors include:  Having hypertension before pregnancy.  Having hypertension during a previous pregnancy.  Being overweight.  Being older than 40.  Being pregnant with more than one baby (multiples).  Having diabetes or kidney problems. SIGNS AND SYMPTOMS Chronic and gestational hypertension rarely cause symptoms. Preeclampsia has symptoms, which may include:  Increased protein in your urine. Your health care provider will check for this at every prenatal visit.  Swelling of your hands and face.  Rapid weight gain.  Headaches.  Visual  changes.  Being bothered by light.  Abdominal pain, especially in the right upper area.  Chest pain.  Shortness of breath.  Increased reflexes.  Seizures. Seizures occur with a more severe form of preeclampsia, called eclampsia. DIAGNOSIS   You may be diagnosed with hypertension during a regular prenatal exam. At each visit, tests may include:  Blood pressure checks.  A urine test to check for protein in your urine.  The type of hypertension you are diagnosed with depends on when you developed it. It also depends on your specific blood pressure reading.  Developing hypertension before 20 weeks of pregnancy is consistent with chronic hypertension.  Developing hypertension after 20 weeks of pregnancy is consistent with gestational hypertension.  Hypertension with increased urinary protein is diagnosed as preeclampsia.  Blood pressure measurements that stay above 160 systolic or 110 diastolic are a sign of severe preeclampsia. TREATMENT Treatment for hypertension during pregnancy varies. Treatment depends on the type of hypertension and how serious it is.  If you take medicine for chronic hypertension, you may need to switch medicines.  Drugs called ACE inhibitors should not be taken during pregnancy.  Low-dose aspirin may be suggested for women who have risk factors for preeclampsia.  If you have gestational hypertension, you may need to take a blood pressure medicine that is safe during pregnancy. Your health care provider will recommend the appropriate medicine.  If you have severe preeclampsia, you may need to be in the hospital. Health care providers will watch you and the baby very closely. You also may need to take medicine (magnesium sulfate) to prevent seizures and lower blood pressure.  Sometimes an early delivery is needed. This may be the case if the condition worsens. It would   be done to protect you and the baby. The only cure for preeclampsia is delivery. HOME  CARE INSTRUCTIONS  Schedule and keep all of your regular appointments for prenatal care.  Only take over-the-counter or prescription medicines as directed by your health care provider. Tell your health care provider about all medicines you take.  Eat as little salt as possible.  Get regular exercise.  Do not drink alcohol.  Do not use tobacco products.  Do not drink products with caffeine.  Lie on your left side when resting. SEEK IMMEDIATE MEDICAL CARE IF:  You have severe abdominal pain.  You have sudden swelling in the hands, ankles, or face.  You gain 4 pounds (1.8 kg) or more in 1 week.  You vomit repeatedly.  You have vaginal bleeding.  You do not feel the baby moving as much.  You have a headache.  You have blurred or double vision.  You have muscle twitching or spasms.  You have shortness of breath.  You have blue fingernails and lips.  You have blood in your urine. MAKE SURE YOU:  Understand these instructions.  Will watch your condition.  Will get help right away if you are not doing well or get worse. Document Released: 09/26/2010 Document Revised: 10/29/2012 Document Reviewed: 08/07/2012 Exit[employer] Patient Information 2014 [employer].  

## 2013-01-31 NOTE — MAU Provider Note (Signed)
History     CSN: [id number]  Arrival date and time: 01/31/13 1840   First Provider Initiated Contact with Patient 01/31/13 1940      Chief Complaint  Patient presents with  . Hypertension   Hypertension Associated symptoms include headaches (posterior) and neck pain (no nuchal rigidity). Pertinent negatives include no blurred vision, chest pain or shortness of breath.   Julie Keller is a 28 y.o. G2P0010 at [redacted]w[redacted]d with Gestational HTN who presented to MAU c/o posterior headache.  Pain is described as dull, achy and radiates into her neck.  Took tylenol and found some improvement, but not relief.  Has had a cold with productive cough for the last week.  Took BP at home - 150s/90s.  Patient has been closely followed in [facility] with biweekly NST and Pre-Eclampsia precautions.  Good fetal movement.  Denies contractions, vaginal bleeding or leakage.  Past Medical History  Diagnosis Date  . Medical history non-contributory   . Supervision of normal pregnancy 11/13/2012    Transfer for care from Dr. Rana Snare at 25 weeks [facility]  Dating Outside Ultrasound  Genetic Screen Too late  Anatomic [place]  29 wks normal > rescan at 33 wks : nl anatomy  GTT Third trimester: 136 > Normal 3 hr  TDaP vaccine  rec'd  Flu vaccine  recd  GBS   Baby Food Breast  Contraception  mirena  Pediatrician Unsure        Past Surgical History  Procedure Laterality Date  . Tonsillectomy    . Migraine    . Cyst excision      Family History  Problem Relation Age of Onset  . Hypertension Maternal Uncle   . Cancer Maternal Grandfather     melanoma and throat cancer  . Diabetes Maternal Grandfather     History  Substance Use Topics  . Smoking status: Former Smoker -- 0.50 packs/day    Types: Cigarettes  . Smokeless tobacco: Never Used  . Alcohol Use: No    Allergies:  Allergies  Allergen Reactions  . Penicillins Other (See Comments)    Pt states overuse of this drug, prefers not to use.    Prescriptions prior  to admission  Medication Sig Dispense Refill  . acetaminophen (TYLENOL) 325 MG tablet Take 650 mg by mouth every 6 (six) hours as needed for pain.      Marland Kitchen guaiFENesin-dextromethorphan (ROBITUSSIN DM) 100-10 MG/5ML syrup Take 10 mLs by mouth every 4 (four) hours as needed for cough.      . Prenatal Vit-Fe Fumarate-FA (PRENATAL MULTIVITAMIN) TABS tablet Take 1 tablet by mouth at bedtime.        Review of Systems  Constitutional: Negative for fever and chills.  Eyes: Negative for blurred vision.  Respiratory: Positive for cough and sputum production. Negative for shortness of breath.   Cardiovascular: Negative for chest pain.  Gastrointestinal: Negative for nausea, vomiting, abdominal pain, diarrhea and constipation.  Musculoskeletal: Positive for neck pain (no nuchal rigidity).  Skin: Negative for rash.  Neurological: Positive for headaches (posterior). Negative for weakness.   Physical Exam   Height 5\' 10"  (1.778 m), weight 136.646 kg (301 lb 4 oz), last menstrual period 05/18/2012.  Physical Exam  Constitutional: She is oriented to person, place, and time. She appears well-developed and well-nourished. No distress.  HENT:  Head: Normocephalic.  Eyes: Pupils are equal, round, and reactive to light. No scleral icterus.  Neck: Normal range of motion.  ROM non-tender.  No nuchal rigidity.  Pain  non-reproducible.    Cardiovascular: Normal rate.   GI: Soft. There is no tenderness. There is no rebound and no guarding.  Neurological: She is alert and oriented to person, place, and time.  Skin: Skin is warm and dry. No rash noted. She is not diaphoretic.  Psychiatric: She has a normal mood and affect. Her behavior is normal.    MAU Course  Procedures  MDM   Assessment and Plan  #Gestational HTN with possible superimposed Pre-Eclampsia - Normotensive in MAU (130s/80s), higher reading at home - Labs drawn on 1/7 by Dr. Marice Potter meet criteria for Pre-E: 2+ in Urine, P2Cr Ratio = .037,  Platelets and LFTS wnl - Continue Pre-E precautions - Given patient is normotensive at today's visit and P0, would like to continue to closely monitor; clinic appt on Monday; NST bi-weekly; will likely need early IOL  #Tension Headache - History is suspicious for tension HA given location, characteristics - Will give Rx for Flexeril for pain relief  TUCKER, BRITTON L 01/31/2013, 7:53 PM   Pt denies epigastric pain, visual disturbances, n/v.    Cervix 1/50/-3, vertex, mid-position today by my exam.  I have seen this patient and agree with the above PA student's note. Consult with Dr Erin Fulling. Pt h/a relieved with Flexeril.  Pt normotensive today.  D/C home with preeclampsia precautions.  Letter provided for pt to miss work until Monday.  Will reevaluate at [facility] clinic appointment.   LEFTWICH-KIRBY, Phong Isenberg Certified Nurse-Midwife

## 2013-01-31 NOTE — MAU Note (Signed)
Pt. Here for evaluation of headache and neck pain that began today. Also took BP at home and it was 150/108. Also is unsure if tylenol 650mg  which was taken at 1815 is helping with headache but did help with sore throat. Pt. Has had a sore throat and cough for 3-4 days. Denies fever. Pt. Has been started this past week doing NST's biweekly due to increased BP. Had one Wednesday and yesterday. Pt. Is not currently on medication for high BP.  Baby has been moving well. Denies any leakage of fluid or blood. Denies heartburn or epigastric pain. Also, denies nausea or vomiting.

## 2013-02-01 LAB — URINE CULTURE

## 2013-02-02 ENCOUNTER — Ambulatory Visit (INDEPENDENT_AMBULATORY_CARE_PROVIDER_SITE_OTHER): Payer: Medicaid Other | Admitting: Obstetrics & Gynecology

## 2013-02-02 VITALS — BP 139/88 | Wt 299.8 lb

## 2013-02-02 DIAGNOSIS — O133 Gestational [pregnancy-induced] hypertension without significant proteinuria, third trimester: Secondary | ICD-10-CM

## 2013-02-02 DIAGNOSIS — O139 Gestational [pregnancy-induced] hypertension without significant proteinuria, unspecified trimester: Secondary | ICD-10-CM

## 2013-02-02 LAB — POCT URINALYSIS DIP (DEVICE)
BILIRUBIN URINE: NEGATIVE
Glucose, UA: NEGATIVE mg/dL
Hgb urine dipstick: NEGATIVE
Nitrite: NEGATIVE
Protein, ur: 100 mg/dL — AB
Specific Gravity, Urine: 1.025 (ref 1.005–1.030)
Urobilinogen, UA: 0.2 mg/dL (ref 0.0–1.0)
pH: 6.5 (ref 5.0–8.0)

## 2013-02-02 NOTE — Progress Notes (Signed)
P = 80  Pt reports she was seen @ MAU on 1/10 due to elevated BP @ home and H/A but was normal here. She denies H/A or visual disturbances today.

## 2013-02-02 NOTE — Patient Instructions (Signed)
Return to clinic for any obstetric concerns or go to MAU for evaluation  

## 2013-02-02 NOTE — Progress Notes (Signed)
01/31/13 MAU visit showed 2+ proteinuria, urine Pr:Cr 0.37.   This gives her a diagnosis of preeclampsia. NST performed today was reviewed and was found to be reactive.  Given her GA, recommended IOL.  Exam on 01/31/13 was 1/50/-3.   Risks of induction reviewed; can take several days, increased risk of cesarean section. All questions answered. IOL scheduled 02/03/13 at 0730, instructions given to patient.

## 2013-02-02 NOTE — MAU Provider Note (Signed)
Attestation of Attending Supervision of Advanced Practitioner (CNM/NP): Evaluation and management procedures were performed by the Advanced Practitioner under my supervision and collaboration.  I have reviewed the Advanced Practitioner's note and chart, and I agree with the management and plan.  HARRAWAY-SMITH, Brendy Ficek 9:10 AM     

## 2013-02-03 ENCOUNTER — Encounter (HOSPITAL_COMMUNITY): Payer: Self-pay

## 2013-02-03 ENCOUNTER — Inpatient Hospital Stay (HOSPITAL_COMMUNITY)
Admission: RE | Admit: 2013-02-03 | Discharge: 2013-02-06 | DRG: 774 | Disposition: A | Discharge status: Home / Self Care | Payer: Medicaid Other | Source: Ambulatory Visit | Attending: Obstetrics and Gynecology | Admitting: Obstetrics and Gynecology

## 2013-02-03 VITALS — BP 132/81 | HR 86 | Temp 97.7°F | Resp 18 | Ht 70.0 in | Wt 299.0 lb

## 2013-02-03 DIAGNOSIS — O1400 Mild to moderate pre-eclampsia, unspecified trimester: Secondary | ICD-10-CM | POA: Diagnosis present

## 2013-02-03 DIAGNOSIS — IMO0002 Reserved for concepts with insufficient information to code with codable children: Principal | ICD-10-CM | POA: Diagnosis present

## 2013-02-03 DIAGNOSIS — Z87891 Personal history of nicotine dependence: Secondary | ICD-10-CM

## 2013-02-03 DIAGNOSIS — O0990 Supervision of high risk pregnancy, unspecified, unspecified trimester: Secondary | ICD-10-CM

## 2013-02-03 DIAGNOSIS — Z3490 Encounter for supervision of normal pregnancy, unspecified, unspecified trimester: Secondary | ICD-10-CM

## 2013-02-03 LAB — COMPREHENSIVE METABOLIC PANEL
ALT: 11 U/L (ref 0–35)
AST: 15 U/L (ref 0–37)
Albumin: 2.4 g/dL — ABNORMAL LOW (ref 3.5–5.2)
Alkaline Phosphatase: 131 U/L — ABNORMAL HIGH (ref 39–117)
BUN: 7 mg/dL (ref 6–23)
CO2: 22 mEq/L (ref 19–32)
Calcium: 9.1 mg/dL (ref 8.4–10.5)
Chloride: 103 mEq/L (ref 96–112)
Creatinine, Ser: 0.52 mg/dL (ref 0.50–1.10)
GLUCOSE: 127 mg/dL — AB (ref 70–99)
Potassium: 4.1 mEq/L (ref 3.7–5.3)
Sodium: 138 mEq/L (ref 137–147)
Total Bilirubin: <0.2 mg/dL — ABNORMAL LOW (ref 0.3–1.2)
Total Protein: 5.8 g/dL — ABNORMAL LOW (ref 6.0–8.3)

## 2013-02-03 LAB — RAPID STREP SCREEN (MED CTR MEBANE ONLY): Streptococcus, Group A Screen (Direct): NEGATIVE

## 2013-02-03 LAB — PROTEIN / CREATININE RATIO, URINE
Creatinine, Urine: 134.61 mg/dL
PROTEIN CREATININE RATIO: 0.54 — AB (ref 0.00–0.15)
TOTAL PROTEIN, URINE: 73.1 mg/dL

## 2013-02-03 LAB — TYPE AND SCREEN
ABO/RH(D): B POS
Antibody Screen: NEGATIVE

## 2013-02-03 LAB — CBC
HCT: 33.2 % — ABNORMAL LOW (ref 36.0–46.0)
HEMOGLOBIN: 11.2 g/dL — AB (ref 12.0–15.0)
MCH: 26.1 pg (ref 26.0–34.0)
MCHC: 33.7 g/dL (ref 30.0–36.0)
MCV: 77.4 fL — ABNORMAL LOW (ref 78.0–100.0)
Platelets: 314 10*3/uL (ref 150–400)
RBC: 4.29 MIL/uL (ref 3.87–5.11)
RDW: 14.7 % (ref 11.5–15.5)
WBC: 10 10*3/uL (ref 4.0–10.5)

## 2013-02-03 LAB — ABO/RH: ABO/RH(D): B POS

## 2013-02-03 LAB — RPR: RPR: NONREACTIVE

## 2013-02-03 MED ORDER — OXYCODONE-ACETAMINOPHEN 5-325 MG PO TABS: 1.0000 | ORAL_TABLET | ORAL | Status: DC | PRN

## 2013-02-03 MED ORDER — CITRIC ACID-SODIUM CITRATE 334-500 MG/5ML PO SOLN
30.0000 mL | ORAL | Status: DC | PRN
Start: 2013-02-03 — End: 2013-02-04

## 2013-02-03 MED ORDER — OXYTOCIN BOLUS FROM INFUSION: 500.0000 mL | INTRAVENOUS | Status: DC

## 2013-02-03 MED ORDER — LACTATED RINGERS IV SOLN
INTRAVENOUS | Status: DC
  Administered 2013-02-03: 09:00:00 via INTRAVENOUS

## 2013-02-03 MED ORDER — TERBUTALINE SULFATE 1 MG/ML IJ SOLN: 0.2500 mg | Freq: Once | INTRAMUSCULAR | Status: AC | PRN

## 2013-02-03 MED ORDER — IBUPROFEN 600 MG PO TABS: 600.0000 mg | ORAL_TABLET | Freq: Four times a day (QID) | ORAL | Status: DC | PRN

## 2013-02-03 MED ORDER — ACETAMINOPHEN 325 MG PO TABS: 650.0000 mg | ORAL_TABLET | ORAL | Status: DC | PRN

## 2013-02-03 MED ORDER — MISOPROSTOL 200 MCG PO TABS
50.0000 ug | ORAL_TABLET | ORAL | Status: DC
  Administered 2013-02-03: 50 ug via ORAL
  Filled 2013-02-03: qty 0.5

## 2013-02-03 MED ORDER — LIDOCAINE HCL (PF) 1 % IJ SOLN
30.0000 mL | INTRAMUSCULAR | Status: DC | PRN
  Administered 2013-02-04: 30 mL via SUBCUTANEOUS
  Filled 2013-02-03 (×2): qty 30

## 2013-02-03 MED ORDER — LACTATED RINGERS IV SOLN: 500.0000 mL | INTRAVENOUS | Status: DC | PRN

## 2013-02-03 MED ORDER — OXYTOCIN 40 UNITS IN LACTATED RINGERS INFUSION - SIMPLE MED: 62.5000 mL/h | INTRAVENOUS | Status: DC

## 2013-02-03 MED ORDER — ONDANSETRON HCL 4 MG/2ML IJ SOLN
4.0000 mg | Freq: Four times a day (QID) | INTRAMUSCULAR | Status: DC | PRN
  Filled 2013-02-03: qty 2

## 2013-02-03 NOTE — H&P (Signed)
LABOR ADMISSION HISTORY AND PHYSICAL   SHARNI NEGRON is a 28 y.o. female G2P0010 with IUP at [redacted]w[redacted]d by LMP c/w early outside [place] presenting for IOL for gHTN. Pt has had a hx of borderline blood pressures and then consistently started having elevated pressures last week.  She was set up for induction due to this.  No ctx, lof, vb. +FM. No HA, vision changes, RUQ pain. + sore throat.   PNCare at [facility] since 25 wks. Prior to that she was a pt of Dr. Vance Gather.  She did have an abnormal 1 hour but normal 3 hour.  Otherwise normal PNC. She wants a mirena and is planning on breast feeding.    Prenatal History/Complications:  Past Medical History: Past Medical History  Diagnosis Date  . Medical history non-contributory   . Supervision of normal pregnancy 11/13/2012    Transfer for care from Dr. Rana Snare at 25 weeks Clinic [facility]  Dating Outside Ultrasound  Genetic Screen Too late  Anatomic [place]  29 wks normal > rescan at 33 wks : nl anatomy  GTT Third trimester: 136 > Normal 3 hr  TDaP vaccine  rec'd  Flu vaccine  recd  GBS   Baby Food Breast  Contraception  mirena  Pediatrician Unsure        Past Surgical History: Past Surgical History  Procedure Laterality Date  . Tonsillectomy    . Migraine    . Cyst excision      Obstetrical History: OB History   Grav Para Term Preterm Abortions TAB SAB Ect Mult Living   2 0 0 0 1 1 0 0 0     G1-- TAB G2- current   Social History: History   Social History  . Marital Status: Single    Spouse Name: N/A    Number of Children: N/A  . Years of Education: N/A   Social History Main Topics  . Smoking status: Former Smoker -- 0.50 packs/day    Types: Cigarettes  . Smokeless tobacco: Never Used  . Alcohol Use: No  . Drug Use: No  . Sexual Activity: Yes    Birth Control/ Protection: Pill   Other Topics Concern  . None   Social History Narrative  . None    Family History: Family History  Problem Relation Age of Onset  . Hypertension Maternal Uncle    . Cancer Maternal Grandfather     melanoma and throat cancer  . Diabetes Maternal Grandfather     Allergies: Allergies  Allergen Reactions  . Penicillins Other (See Comments)    Pt states overuse of this drug, prefers not to use.    Prescriptions prior to admission  Medication Sig Dispense Refill  . acetaminophen (TYLENOL) 325 MG tablet Take 650 mg by mouth every 6 (six) hours as needed for pain.      Marland Kitchen guaiFENesin-dextromethorphan (ROBITUSSIN DM) 100-10 MG/5ML syrup Take 10 mLs by mouth every 4 (four) hours as needed for cough.      . Prenatal Vit-Fe Fumarate-FA (PRENATAL MULTIVITAMIN) TABS tablet Take 1 tablet by mouth at bedtime.         Review of Systems  All systems reviewed and negative except as stated in HPI    Blood pressure 147/87, pulse 70, temperature 98.6 F (37 C), resp. rate 18, height 5\' 10"  (1.778 m), weight 135.626 kg (299 lb), last menstrual period 05/18/2012. General appearance: alert, cooperative and no distress HEENT: exudative pharyngitis.   Lungs: clear to auscultation bilaterally Heart: regular rate  and rhythm Abdomen: soft, non-tender; bowel sounds normal Extremities: Homans sign is negative, no sign of DVT DTR's 2+ Presentation: cephalic Fetal monitoringBaseline: 145 bpm, Variability: Good {> 6 bpm), Accelerations: Reactive and Decelerations: Absent Uterine activity None Dilation: 1.5 Effacement (%): 50 Station: -3 Exam by:: beck, md   Prenatal labs: ABO, Rh: --/--/B POS (01/13 0815) Antibody: PENDING (01/13 0815) Rubella:   RPR: NON REAC (11/13 1157)  HBsAg: Negative (07/01 0000)  HIV: NON REACTIVE (11/13 1157)  GBS: Negative (07/01 0000)  1 hr Glucola abnormal. Normal 3 hour Genetic screening  Too late Anatomy US normal but limited anatomy views due to poor visualization    Assessment: ANYAE GRIFFITH is a 28 y.o. G2P0010 with an IUP at [redacted]w[redacted]d presenting for IOL for gHTN.    Plan: 1) IOL for gHTN - cervix medium consistency .  [place]ishops of 3.  - start with po cytotec - plan for FB after first dose.   2) FWB - cat I tracing - GBS neg - vertex - EFW difficult due to maternal habitus but likely 7 lbs  3) sore throat - pt wanting to be treated for strep throat - ranson criteria of 1 - doubt given also hx of tonsillectomy and ranson criteria - rapid strep sent  4) Anticipate SVD   BECK, KELI L, MD 02/03/2013, 10:00 AM `````Attestation of Attending Supervision of Advanced Practitioner: Evaluation and management procedures were performed by the PA/NP/CNM/OB Fellow under my supervision/collaboration. Chart reviewed and agree with management and plan.  Tilda Burrow 02/04/2013 9:36 PM

## 2013-02-03 NOTE — Progress Notes (Signed)
Julie Keller is a 28 y.o. G2P0010 at [redacted]w[redacted]d admitted for gHTN  Subjective: Pt comfortable. Contractions minimal. +FM.  No HA, vision changes, abd pain.   Objective: BP 140/77  Pulse 75  Temp(Src) 98.6 F (37 C)  Resp 18  Ht 5\' 10"  (1.778 m)  Wt 135.626 kg (299 lb)  BMI 42.90 kg/m2  LMP 05/18/2012     FHT:  FHR: 130 bpm, variability: moderate,  accelerations:  Present,  decelerations:  Absent UC:   Difficult to trace but rare.  SVE:   Dilation: 1.5 Effacement (%): 50 Station: -3 Exam by:: Wylie Russon, md  Labs: Lab Results  Component Value Date   WBC 10.0 02/03/2013   HGB 11.2* 02/03/2013   HCT 33.2* 02/03/2013   MCV 77.4* 02/03/2013   PLT 314 02/03/2013    Assessment / Plan: IOL for gHTN  Labor: s/p cytotec. FB placed without difficulty and inflated with 60 cc of LR gHTN:  no severe range. labs stable Fetal Wellbeing:  Category I Pain Control:  Labor support without medications I/D:  n/a Anticipated MOD:  NSVD  Julie Keller L 02/03/2013, 3:34 PM

## 2013-02-03 NOTE — Progress Notes (Signed)
Patient ID: Julie Keller, female   DOB: May 19, 1985, 28 y.o.   MRN: [id number]  Of note, in review of record and in labs from today, it appears pt more correctly has a diagnosis of mild pre-Eclampsia with a prot/crea ratio of 0.37 last week and now on repeat of 0.54.  BP have remained stable in the 140s/80s throughout labor. Will cont to observe for now without magnesium. Low threshold to start mag however.    FWB- cat I tracing.

## 2013-02-04 ENCOUNTER — Inpatient Hospital Stay (HOSPITAL_COMMUNITY): Payer: Medicaid Other | Admitting: Anesthesiology

## 2013-02-04 ENCOUNTER — Encounter (HOSPITAL_COMMUNITY): Payer: Self-pay

## 2013-02-04 ENCOUNTER — Encounter (HOSPITAL_COMMUNITY): Payer: Medicaid Other | Admitting: Anesthesiology

## 2013-02-04 DIAGNOSIS — IMO0002 Reserved for concepts with insufficient information to code with codable children: Secondary | ICD-10-CM

## 2013-02-04 MED ORDER — PHENYLEPHRINE 40 MCG/ML (10ML) SYRINGE FOR IV PUSH (FOR BLOOD PRESSURE SUPPORT)
80.0000 ug | PREFILLED_SYRINGE | INTRAVENOUS | Status: DC | PRN
  Filled 2013-02-04: qty 2

## 2013-02-04 MED ORDER — SIMETHICONE 80 MG PO CHEW: 80.0000 mg | CHEWABLE_TABLET | ORAL | Status: DC | PRN

## 2013-02-04 MED ORDER — LIDOCAINE HCL (PF) 1 % IJ SOLN
INTRAMUSCULAR | Status: DC | PRN
  Administered 2013-02-04 (×2): 9 mL

## 2013-02-04 MED ORDER — PHENYLEPHRINE 40 MCG/ML (10ML) SYRINGE FOR IV PUSH (FOR BLOOD PRESSURE SUPPORT)
80.0000 ug | PREFILLED_SYRINGE | INTRAVENOUS | Status: DC | PRN
  Filled 2013-02-04: qty 10
  Filled 2013-02-04: qty 2

## 2013-02-04 MED ORDER — FENTANYL 2.5 MCG/ML BUPIVACAINE 1/10 % EPIDURAL INFUSION (WH - ANES)
14.0000 mL/h | INTRAMUSCULAR | Status: DC | PRN
  Filled 2013-02-04: qty 125

## 2013-02-04 MED ORDER — LANOLIN HYDROUS EX OINT: TOPICAL_OINTMENT | CUTANEOUS | Status: DC | PRN

## 2013-02-04 MED ORDER — DIPHENHYDRAMINE HCL 25 MG PO CAPS: 25.0000 mg | ORAL_CAPSULE | Freq: Four times a day (QID) | ORAL | Status: DC | PRN

## 2013-02-04 MED ORDER — FENTANYL 2.5 MCG/ML BUPIVACAINE 1/10 % EPIDURAL INFUSION (WH - ANES)
INTRAMUSCULAR | Status: DC | PRN
  Administered 2013-02-04: 14 mL/h via EPIDURAL

## 2013-02-04 MED ORDER — PRENATAL MULTIVITAMIN CH
1.0000 | ORAL_TABLET | Freq: Every day | ORAL | Status: DC
  Administered 2013-02-05 – 2013-02-06 (×2): 1 via ORAL
  Filled 2013-02-04 (×2): qty 1

## 2013-02-04 MED ORDER — TERBUTALINE SULFATE 1 MG/ML IJ SOLN: 0.2500 mg | Freq: Once | INTRAMUSCULAR | Status: DC | PRN

## 2013-02-04 MED ORDER — FENTANYL CITRATE 0.05 MG/ML IJ SOLN
100.0000 ug | INTRAMUSCULAR | Status: DC | PRN
  Administered 2013-02-04: 100 ug via INTRAVENOUS
  Filled 2013-02-04: qty 2

## 2013-02-04 MED ORDER — MEASLES, MUMPS & RUBELLA VAC ~~LOC~~ INJ
0.5000 mL | INJECTION | Freq: Once | SUBCUTANEOUS | Status: AC
  Administered 2013-02-06: 0.5 mL via SUBCUTANEOUS
  Filled 2013-02-04 (×2): qty 0.5

## 2013-02-04 MED ORDER — LACTATED RINGERS IV SOLN: 500.0000 mL | Freq: Once | INTRAVENOUS | Status: DC

## 2013-02-04 MED ORDER — DIPHENHYDRAMINE HCL 50 MG/ML IJ SOLN: 12.5000 mg | INTRAMUSCULAR | Status: DC | PRN

## 2013-02-04 MED ORDER — EPHEDRINE 5 MG/ML INJ
10.0000 mg | INTRAVENOUS | Status: DC | PRN
  Filled 2013-02-04: qty 2

## 2013-02-04 MED ORDER — ZOLPIDEM TARTRATE 5 MG PO TABS: 5.0000 mg | ORAL_TABLET | Freq: Every evening | ORAL | Status: DC | PRN

## 2013-02-04 MED ORDER — SENNOSIDES-DOCUSATE SODIUM 8.6-50 MG PO TABS
2.0000 | ORAL_TABLET | ORAL | Status: DC
  Administered 2013-02-05 – 2013-02-06 (×2): 2 via ORAL
  Filled 2013-02-04 (×2): qty 2

## 2013-02-04 MED ORDER — ONDANSETRON HCL 4 MG/2ML IJ SOLN: 4.0000 mg | INTRAMUSCULAR | Status: DC | PRN

## 2013-02-04 MED ORDER — OXYCODONE-ACETAMINOPHEN 5-325 MG PO TABS: 1.0000 | ORAL_TABLET | ORAL | Status: DC | PRN

## 2013-02-04 MED ORDER — BENZOCAINE-MENTHOL 20-0.5 % EX AERO
1.0000 "application " | INHALATION_SPRAY | CUTANEOUS | Status: DC | PRN
  Administered 2013-02-05: 1 via TOPICAL
  Filled 2013-02-04: qty 56

## 2013-02-04 MED ORDER — IBUPROFEN 600 MG PO TABS
600.0000 mg | ORAL_TABLET | Freq: Four times a day (QID) | ORAL | Status: DC
  Administered 2013-02-05 – 2013-02-06 (×7): 600 mg via ORAL
  Filled 2013-02-04 (×7): qty 1

## 2013-02-04 MED ORDER — EPHEDRINE 5 MG/ML INJ
10.0000 mg | INTRAVENOUS | Status: DC | PRN
  Filled 2013-02-04: qty 2
  Filled 2013-02-04: qty 4

## 2013-02-04 MED ORDER — DIBUCAINE 1 % RE OINT: 1.0000 "application " | TOPICAL_OINTMENT | RECTAL | Status: DC | PRN

## 2013-02-04 MED ORDER — TETANUS-DIPHTH-ACELL PERTUSSIS 5-2.5-18.5 LF-MCG/0.5 IM SUSP: 0.5000 mL | Freq: Once | INTRAMUSCULAR | Status: DC

## 2013-02-04 MED ORDER — OXYTOCIN 40 UNITS IN LACTATED RINGERS INFUSION - SIMPLE MED
1.0000 m[IU]/min | INTRAVENOUS | Status: DC
  Administered 2013-02-04: 8 m[IU]/min via INTRAVENOUS
  Administered 2013-02-04: 250 m[IU]/min via INTRAVENOUS
  Administered 2013-02-04: 8 m[IU]/min via INTRAVENOUS
  Administered 2013-02-04: 2 m[IU]/min via INTRAVENOUS
  Administered 2013-02-04: 10 m[IU]/min via INTRAVENOUS
  Administered 2013-02-04: 12 m[IU]/min via INTRAVENOUS
  Administered 2013-02-04: 6 m[IU]/min via INTRAVENOUS
  Administered 2013-02-04: 8 m[IU]/min via INTRAVENOUS
  Administered 2013-02-04: 6 m[IU]/min via INTRAVENOUS
  Administered 2013-02-04: 14 m[IU]/min via INTRAVENOUS
  Administered 2013-02-04: 16 m[IU]/min via INTRAVENOUS
  Administered 2013-02-04 (×2): 10 m[IU]/min via INTRAVENOUS
  Filled 2013-02-04: qty 1000

## 2013-02-04 MED ORDER — ONDANSETRON HCL 4 MG PO TABS: 4.0000 mg | ORAL_TABLET | ORAL | Status: DC | PRN

## 2013-02-04 MED ORDER — WITCH HAZEL-GLYCERIN EX PADS: 1.0000 "application " | MEDICATED_PAD | CUTANEOUS | Status: DC | PRN

## 2013-02-04 NOTE — Progress Notes (Signed)
Julie Keller is a 28 y.o. G2P0010 at [redacted]w[redacted]d by LMP admitted for active labor  Subjective:  No complaints, asleep    Objective: BP 133/81  Pulse 82  Temp(Src) 98.2 F (36.8 C) (Oral)  Resp 18  Ht 5\' 10"  (1.778 m)  Wt 135.626 kg (299 lb)  BMI 42.90 kg/m2  LMP 05/18/2012      FHT: Poor Strip tracings, baseline about 140, moderate variability, present accelerations, absent decels  UC:   none SVE:   Dilation: 1.5 Effacement (%): 50 Station: -3 Exam by:: beck, md  Labs: Lab Results  Component Value Date   WBC 10.0 02/03/2013   HGB 11.2* 02/03/2013   HCT 33.2* 02/03/2013   MCV 77.4* 02/03/2013   PLT 314 02/03/2013    Assessment / Plan: FB in place, continue to monitor  Labor: Progressing normally Fetal Wellbeing:  Category II Pain Control:  Labor support without medications I/D:  n/a Anticipated MOD:  NSVD  Syrus Nakama, 02/05/2013 02/04/2013, 1:12 AM

## 2013-02-04 NOTE — Progress Notes (Signed)
Patient ID: Julie Keller, female   DOB: 03/25/1985, 28 y.o.   MRN: [id number] Julie Keller is a 28 y.o. G2P0010 at [redacted]w[redacted]d by LMP admitted for induction of labor due to Pre-eclamptic toxemia of pregnancy..  Subjective: Patient comfortable with good pain control on epidural, No headache, visual changes, SOB or abdominal pain  BP 151/69 last check at 6:11 pm   Objective: BP 151/69  Pulse 87  Temp(Src) 98.1 F (36.7 C) (Oral)  Resp 20  Ht 5\' 10"  (1.778 m)  Wt 135.626 kg (299 lb)  BMI 42.90 kg/m2  SpO2 99%  LMP 05/18/2012      FHT:  FHR: 130 bpm, variability: moderate,  accelerations:  Present,  decelerations:  Absent UC:   regular, every 2-3 minutes SVE:   Dilation: 9 Effacement (%): 90 Station: +1 Exam by:: Chivon Lepage  Labs: Lab Results  Component Value Date   WBC 10.0 02/03/2013   HGB 11.2* 02/03/2013   HCT 33.2* 02/03/2013   MCV 77.4* 02/03/2013   PLT 314 02/03/2013    Assessment / Plan: Induction of labor due to preeclampsia,  progressing well on pitocin  Labor: Progressing normally Preeclampsia:  no signs or symptoms of toxicity Fetal Wellbeing:  Category I Pain Control:  Epidural I/D:  n/a Anticipated MOD:  NSVD  Julie Keller 02/04/2013, 6:23 PM  I have seen and examined this patient and agree with above documentation in the medical student's note. Pt progressed quickly over last few hours and is now 9/90/+1. Anticipate SVD.   09-04-1986, M.D. [facility] Fellow 02/04/2013 7:05 PM

## 2013-02-04 NOTE — Anesthesia Procedure Notes (Signed)
Epidural Patient location during procedure: OB Start time: 02/04/2013 5:20 PM End time: 02/04/2013 5:24 PM  Staffing Anesthesiologist: Leilani Able Performed by: anesthesiologist   Preanesthetic Checklist Completed: patient identified, surgical consent, pre-op evaluation, timeout performed, IV checked, risks and benefits discussed and monitors and equipment checked  Epidural Patient position: sitting Prep: site prepped and draped and DuraPrep Patient monitoring: continuous pulse ox and blood pressure Approach: midline Injection technique: LOR air  Needle:  Needle type: Tuohy  Needle gauge: 17 G Needle length: 9 cm and 9 Needle insertion depth: 6 cm Catheter type: closed end flexible Catheter size: 19 Gauge Catheter at skin depth: 11 cm Test dose: negative and Other  Assessment Sensory level: T9 Events: blood not aspirated, injection not painful, no injection resistance, negative IV test and no paresthesia  Additional Notes Reason for block:procedure for pain

## 2013-02-04 NOTE — Progress Notes (Signed)
Patient ID: Julie Keller, female   DOB: 1985/03/11, 28 y.o.   MRN: [id number] Julie Keller is a 28 y.o. G2P0010 at [redacted]w[redacted]d by LMP admitted for induction of labor due to Pre-eclamptic toxemia of pregnancy..  Subjective: Patient doing well but in pain, with increasing intensity and frequency of contractions Spontaneous rupture of membranes No headache, visual changes, SOB or abdominal pain BP stable  Objective: BP 145/91  Pulse 87  Temp(Src) 97.9 F (36.6 C) (Oral)  Resp 20  Ht 5\' 10"  (1.778 m)  Wt 135.626 kg (299 lb)  BMI 42.90 kg/m2  LMP 05/18/2012      FHT:  FHR: 150 bpm, variability: moderate,  accelerations:  Present,  decelerations:  Present variable with quick recovery UC:   regular, every 2-3 minutes SVE:   Dilation: 4 Effacement (%): 80 Station: -2;-1 Exam by:: Hall rN  Labs: Lab Results  Component Value Date   WBC 10.0 02/03/2013   HGB 11.2* 02/03/2013   HCT 33.2* 02/03/2013   MCV 77.4* 02/03/2013   PLT 314 02/03/2013    Assessment / Plan: Induction of labor due to preeclampsia,  progressing well on pitocin  Labor: progressing normally on pitocin Preeclampsia:  no signs or symptoms of toxicity Fetal Wellbeing:  Category II Pain Control:  Fentanyl ordered per patients request I/D:  n/a Anticipated MOD:  NSVD  [name], [name] 02/04/2013, 2:52 PM  I have seen and examined this patient and agree with above documentation in the med student's note. Pt with SROM and clear fluid. Now contracting painfully and requesting epidural. On 82mu/min of pit. Cervix unchanged from last check.    13m, M.D. [facility] Fellow 02/04/2013 3:44 PM

## 2013-02-04 NOTE — Progress Notes (Signed)
SVD see delivery summary

## 2013-02-04 NOTE — Anesthesia Preprocedure Evaluation (Signed)
Anesthesia Evaluation  Patient identified by MRN, date of birth, ID band Patient awake    Reviewed: Allergy & Precautions, H&P , NPO status , Patient's Chart, lab work & pertinent test results  Airway Mallampati: II TM Distance: >3 FB Neck ROM: full    Dental no notable dental hx.    Pulmonary former smoker,    Pulmonary exam normal       Cardiovascular negative cardio ROS      Neuro/Psych negative neurological ROS  negative psych ROS   GI/Hepatic negative GI ROS, Neg liver ROS,   Endo/Other  Morbid obesity  Renal/GU negative Renal ROS     Musculoskeletal   Abdominal (+) + obese,   Peds  Hematology negative hematology ROS (+)   Anesthesia Other Findings   Reproductive/Obstetrics (+) Pregnancy                           Anesthesia Physical Anesthesia Plan  ASA: III  Anesthesia Plan: Epidural   Post-op Pain Management:    Induction:   Airway Management Planned:   Additional Equipment:   Intra-op Plan:   Post-operative Plan:   Informed Consent: I have reviewed the patients History and Physical, chart, labs and discussed the procedure including the risks, benefits and alternatives for the proposed anesthesia with the patient or authorized representative who has indicated his/her understanding and acceptance.     Plan Discussed with:   Anesthesia Plan Comments:         Anesthesia Quick Evaluation

## 2013-02-04 NOTE — Progress Notes (Signed)
Patient ID: Julie Keller, female   DOB: [birth year], 28 y.o.   MRN: [id number] Julie Keller is a 28 y.o. G2P0010 at [redacted]w[redacted]d by LMP admitted for induction of labor due to Pre-eclampsia  Subjective: Patient not in Pain, reports Good fetal movt No headaches, visual changes, SOB, or abdominal Pain. Blood pressure stable: last check 125/92 at 10:14 am  Objective: BP 146/96  Pulse 82  Temp(Src) 98.1 F (36.7 C) (Oral)  Resp 18  Ht 5\' 10"  (1.778 m)  Wt 135.626 kg (299 lb)  BMI 42.90 kg/m2  LMP 05/18/2012      FHT:  FHR: 150 bpm, variability: moderate,  accelerations:  Present,  decelerations:  Absent UC:   irregular,  SVE:   Dilation: 4 Effacement (%): 80 Station: -2 Exam by:: [email or web address], [employer],    tammy hall rn  Labs: Lab Results  Component Value Date   WBC 10.0 02/03/2013   HGB 11.2* 02/03/2013   HCT 33.2* 02/03/2013   MCV 77.4* 02/03/2013   PLT 314 02/03/2013    Assessment / Plan: Induction of labor for preeclampsia, Progressing normally on Pitocin Continue to monitor labor progress  Labor: Progressing normally on Pitocin Preeclampsia:  no signs or symptoms of toxicity Fetal Wellbeing:  Category I Pain Control:  Labor support without medications I/D:  n/a Anticipated MOD:  NSVD  [name], [name] 02/04/2013, 10:40 AM  I have reviewed the medical student note as above. Pt with BPs in the 140s/80s and not meeting criteria for severe pre-E. Currently not on magnesium.  I&Os stable.    Kobee Medlen, 02/06/2013, MD

## 2013-02-04 NOTE — Progress Notes (Signed)
JAMELL OPFER is a 28 y.o. G2P0010 at [redacted]w[redacted]d by LMP admitted for induction of labor due to Pre-eclampsia  Subjective:  Patient is starting to feel uterine contractions but reports tolerable pain, Good fetal movt  No headaches, visual changes, SOB, or abdominal Pain.  Blood pressure stable  Objective: BP 124/61  Pulse 89  Temp(Src) 97.9 F (36.6 C) (Oral)  Resp 20  Ht 5\' 10"  (1.778 m)  Wt 135.626 kg (299 lb)  BMI 42.90 kg/m2  LMP 05/18/2012      FHT:  FHR: 150 bpm, variability: moderate,  accelerations:  Present,  decelerations:  Absent UC:   irregular SVE:  Not done Labs: Lab Results  Component Value Date   WBC 10.0 02/03/2013   HGB 11.2* 02/03/2013   HCT 33.2* 02/03/2013   MCV 77.4* 02/03/2013   PLT 314 02/03/2013    Assessment / Plan: Induction of labor due to preeclampsia,  progressing well on pitocin  Labor: Progressing on Pitocin, will continue to increase then AROM Preeclampsia:  no signs or symptoms of toxicity Fetal Wellbeing:  Category I Pain Control:  Labor support without medications I/D:  n/a Anticipated MOD:  NSVD  [name], [name] 02/04/2013, 12:24 PM   I have reviewed the documentation by this medical student and agree with above.   02/06/2013, M.D. [facility] Fellow 02/04/2013 3:42 PM

## 2013-02-04 NOTE — Progress Notes (Signed)
Julie Keller is a 28 y.o. G2P0010 at [redacted]w[redacted]d.  Subjective: Sleeping w/ contractions.  Objective: BP 142/83  Pulse 80  Temp(Src) 98.2 F (36.8 C) (Oral)  Resp 18  Ht 5\' 10"  (1.778 m)  Wt 135.626 kg (299 lb)  BMI 42.90 kg/m2  LMP 05/18/2012     FHT:  FHR: 140 bpm, variability: moderate,  accelerations:  Present,  decelerations:  Absent UC:   irregular, every 1-4? Minutes, mild ( difficult to trace due to to maternal body habitus and position. Changed to [facility] monitor. Still not tracing well consistently. RN adjusted.    SVE:   Dilation: 4 Effacement (%): 50 Station: -2 Exam by:: VSmith, CNM Foley bulb out.   Labs: Lab Results  Component Value Date   WBC 10.0 02/03/2013   HGB 11.2* 02/03/2013   HCT 33.2* 02/03/2013   MCV 77.4* 02/03/2013   PLT 314 02/03/2013    Assessment / Plan: Induction of labor due to preeclampsia,  progressing well on pitocin  Labor: Progressing normally Preeclampsia:  labs stable Fetal Wellbeing:  Category I Pain Control:  Labor support without medications I/D:  n/a Anticipated MOD:  NSVD Start pitocin  Maeghan Canny 02/04/2013, 5:00 AM

## 2013-02-05 ENCOUNTER — Encounter (HOSPITAL_COMMUNITY): Payer: Self-pay

## 2013-02-05 LAB — CBC
HCT: 29 % — ABNORMAL LOW (ref 36.0–46.0)
Hemoglobin: 9.6 g/dL — ABNORMAL LOW (ref 12.0–15.0)
MCH: 25.7 pg — AB (ref 26.0–34.0)
MCHC: 33.1 g/dL (ref 30.0–36.0)
MCV: 77.5 fL — ABNORMAL LOW (ref 78.0–100.0)
Platelets: 267 10*3/uL (ref 150–400)
RBC: 3.74 MIL/uL — ABNORMAL LOW (ref 3.87–5.11)
RDW: 14.9 % (ref 11.5–15.5)
WBC: 15.3 10*3/uL — ABNORMAL HIGH (ref 4.0–10.5)

## 2013-02-05 LAB — CULTURE, GROUP A STREP

## 2013-02-05 MED ORDER — MENTHOL 3 MG MT LOZG
1.0000 | LOZENGE | OROMUCOSAL | Status: DC | PRN
  Administered 2013-02-06: 3 mg via ORAL
  Filled 2013-02-05: qty 9

## 2013-02-05 NOTE — Progress Notes (Signed)
UR chart review completed.  

## 2013-02-05 NOTE — Progress Notes (Signed)
Patient ID: Julie Keller, female   DOB: Mar 06, 1985, 28 y.o.   MRN: [id number] Post Partum Day 1 Subjective: Patient is comfortable, complains of mild pain in the perineum and vagina(4/10), bleeding is mild, lighter than yesterday. She is walking and passing gas. No bowel movt yet. BP is stable, no headaches, visual changes, SOB or abdominal pain. Desires Mirena for contraception Breastfeeding  Objective: Blood pressure 120/87, pulse 91, temperature 97.3 F (36.3 C), temperature source Oral, resp. rate 20, height 5\' 10"  (1.778 m), weight 135.626 kg (299 lb), last menstrual period 05/18/2012, SpO2 98.00%, unknown if currently breastfeeding.  Physical Exam:  General: alert, cooperative and morbidly obese Lochia: appropriate Uterine Fundus: firm DVT Evaluation: No evidence of DVT seen on physical exam.   Recent Labs  02/03/13 0815 02/05/13 0530  HGB 11.2* 9.6*  HCT 33.2* 29.0*    Assessment/Plan: Plan for discharge tomorrow, Lactation consult and Contraception Mirena   LOS: 2 days   Azizi Bally 02/05/2013, 7:23 AM

## 2013-02-05 NOTE — Anesthesia Postprocedure Evaluation (Signed)
Anesthesia Post Note  Patient: Julie Keller  Procedure(s) Performed: * No procedures listed *  Anesthesia type: Epidural  Patient location: Mother/Baby  Post pain: Pain level controlled  Post assessment: Post-op Vital signs reviewed  Last Vitals:  Filed Vitals:   02/05/13 0701  BP: 120/87  Pulse: 91  Temp: 36.3 C  Resp: 20    Post vital signs: Reviewed  Level of consciousness:alert  Complications: No apparent anesthesia complications

## 2013-02-05 NOTE — Progress Notes (Signed)
I have seen and examined this patient and I agree with the above. Cam Hai 8:46 AM 02/05/2013

## 2013-02-06 MED ORDER — IBUPROFEN 200 MG PO TABS: 200.0000 mg | ORAL_TABLET | Freq: Four times a day (QID) | ORAL | Status: AC | PRN

## 2013-02-06 NOTE — Discharge Summary (Signed)
Attestation of Attending Supervision of Obstetric Fellow: Evaluation and management procedures were performed by the Obstetric Fellow under my supervision and collaboration.  I have reviewed the Obstetric Fellow's note and chart, and I agree with the management and plan.  Kahlen Morais, MD, FACOG Attending Obstetrician & Gynecologist Faculty Practice, [facility]   

## 2013-02-06 NOTE — Discharge Instructions (Signed)
Breast Pumping Tips Pumping breast milk is a good way produce more milk and a steady supply for your infant. In general, the more you breastfeed or pump, the more milk you will produce. Talk to your doctor or breastfeeding specialist if you need more information or support. HOME CARE  Drink enough fluids to keep your pee (urine) clear or pale yellow.  Eat a healthy diet.  Exercise as told by your doctor.  Rest often. Sleep when your infant sleeps.  Do not smoke.  Ask your doctor about birth control options. Pumping breastmilk:  Relax and find a quiet place to pump. Breast massage, soothing heat on your breasts, music, pictures, or tape recordings of your infant may help you relax.  Place the suction cup of the pump right over the nipple.  Some discomfort is normal at first. If pumping continues to be painful, you may need a different pump. Talk to your breastfeeding specialist.  Put lanolin ointment on sore nipples and the areola.  Pump after each feeding session. This will boost your milk supply.  If you are away from your infant for several hours, pump for 15 minutes every 2 to 3 hours. Pump both breasts.  If your infant has a formula feeding, pump around the same time.  Pump a few weeks before you go back to work. This will help you find a routine that works for you. GET HELP RIGHT AWAY IF:   You are having trouble pumping or feeding your infant.  You think you are not making enough milk.  You have nipple pain, soreness, or redness.  You have other questions or concerns. MAKE SURE YOU:   Understand these instructions.  Will watch your condition.  Will get help right away if you are not doing well or get worse. Document Released: 06/27/2007 Document Revised: 04/02/2011 Document Reviewed: 06/27/2007 [facility] Patient Information 2014 [place], [place]. Vaginal Delivery During delivery, your health care provider will help you give birth to your baby. During a vaginal  delivery, you will work to push the baby out of your vagina. However, before you can push your baby out, a few things need to happen. The opening of your uterus (cervix) has to soften, thin out, and open up (dilate) all the way to 10 cm. Also, your baby has to move down from the uterus into your vagina.  SIGNS OF LABOR  Your health care provider will first need to make sure you are in labor. Signs of labor include:   Passing what is called the mucous plug before labor begins. This is a small amount of blood-stained mucus.   Having regular, painful uterine contractions.   The time between contractions gets shorter.   The discomfort and pain gradually get more intense.  Contraction pains get worse when walking and do not go away when resting.   Your cervix becomes thinner (effacement) and dilates. BEFORE THE DELIVERY Once you are in labor and admitted into the hospital or care center, your health care provider may do the following:   Perform a complete physical exam.  Review any complications related to pregnancy or labor.  Check your blood pressure, pulse, temperature, and heart rate (vital signs).   Determine if, and when, the rupture of amniotic membranes occurred.  Do a vaginal exam (using a sterile glove and lubricant) to determine:   The position (presentation) of the baby. Is the baby's head presenting first (vertex) in the birth canal (vagina), or are the feet or buttocks first (breech)?  The level (station) of the baby's head within the birth canal.   The effacement and dilatation of the cervix.   An electronic fetal monitor is usually placed on your abdomen when you first arrive. This is used to monitor your contractions and the baby's heart rate.  When the monitor is on your abdomen (external fetal monitor), it can only pick up the frequency and length of your contractions. It cannot tell the strength of your contractions.  If it becomes necessary for your  health care provider to know exactly how strong your contractions are or to see exactly what the baby's heart rate is doing, an internal monitor may be inserted into your vagina and uterus. Your health care provider will discuss the benefits and risks of using an internal monitor and obtain your permission before inserting the device.  Continuous fetal monitoring may be needed if you have an epidural, are receiving certain medicines (such as oxytocin), or have pregnancy or labor complications.  An IV access tube may be placed into a vein in your arm to deliver fluids and medicines if necessary. THREE STAGES OF LABOR AND DELIVERY Normal labor and delivery is divided into three stages. First Stage This stage starts when you begin to contract regularly and your cervix begins to efface and dilate. It ends when your cervix is completely open (fully dilated). The first stage is the longest stage of labor and can last from 3 hours to 15 hours.  Several methods are available to help with labor pain. You and your health care provider will decide which option is best for you. Options include:   Opioid medicines. These are strong pain medicines that you can get through your IV tube or as a shot into your muscle. These medicines lessen pain but do not make it go away completely.  Epidural. A medicine is given through a thin tube that is inserted in your back. The medicine numbs the lower part of your body and prevents any pain in that area.  Paracervical pain medicine. This is an injection of an anesthetic on each side of your cervix.   You may request natural childbirth, which does not involve the use of pain medicines or an epidural during labor and delivery. Instead, you will use other things, such as breathing exercises, to help cope with the pain. Second Stage The second stage of labor begins when your cervix is fully dilated at 10 cm. It continues until you push your baby down through the birth canal  and the baby is born. This stage can take only minutes or several hours.  The location of your baby's head as it moves through the birth canal is reported as a number called a station. If the baby's head has not started its descent, the station is described as being at minus 3 ( 3). When your baby's head is at the zero station, it is at the middle of the birth canal and is engaged in the pelvis. The station of your baby helps indicate the progress of the second stage of labor.  When your baby is born, your health care provider may hold the baby with his or her head lowered to prevent amniotic fluid, mucus, and blood from getting into the baby's lungs. The baby's mouth and nose may be suctioned with a small bulb syringe to remove any additional fluid.  Your health care provider may then place the baby on your stomach. It is important to keep the baby from getting cold.  To do this, the health care provider will dry the baby off, place the baby directly on your skin (with no blankets between you and the baby), and cover the baby with warm, dry blankets.   The umbilical cord is cut. Third Stage During the third stage of labor, your health care provider will deliver the placenta (afterbirth) and make sure your bleeding is under control. The delivery of the placenta usually takes about 5 minutes but can take up to 30 minutes. After the placenta is delivered, a medicine may be given either by IV or injection to help contract the uterus and control bleeding. If you are planning to breastfeed, you can try to do so now. After you deliver the placenta, your uterus should contract and get very firm. If your uterus does not remain firm, your health care provider will massage it. This is important because the contraction of the uterus helps cut off bleeding at the site where the placenta was attached to your uterus. If your uterus does not contract properly and stay firm, you may continue to bleed heavily. If there is  a lot of bleeding, medicines may be given to contract the uterus and stop the bleeding.  Document Released: 10/18/2007 Document Revised: 09/10/2012 Document Reviewed: 06/29/2012 [facility] Patient Information 2014 [place], [place]. Breastfeeding Deciding to breastfeed is one of the best choices you can make for you and your baby. A change in hormones during pregnancy causes your breast tissue to grow and increases the number and size of your milk ducts. These hormones also allow proteins, sugars, and fats from your blood supply to make breast milk in your milk-producing glands. Hormones prevent breast milk from being released before your baby is born as well as prompt milk flow after birth. Once breastfeeding has begun, thoughts of your baby, as well as his or her sucking or crying, can stimulate the release of milk from your milk-producing glands.  BENEFITS OF BREASTFEEDING For Your Baby  Your first milk (colostrum) helps your baby's digestive system function better.   There are antibodies in your milk that help your baby fight off infections.   Your baby has a lower incidence of asthma, allergies, and sudden infant death syndrome.   The nutrients in breast milk are better for your baby than infant formulas and are designed uniquely for your baby's needs.   Breast milk improves your baby's brain development.   Your baby is less likely to develop other conditions, such as childhood obesity, asthma, or type 2 diabetes mellitus.  For You   Breastfeeding helps to create a very special bond between you and your baby.   Breastfeeding is convenient. Breast milk is always available at the correct temperature and costs nothing.   Breastfeeding helps to burn calories and helps you lose the weight gained during pregnancy.   Breastfeeding makes your uterus contract to its prepregnancy size faster and slows bleeding (lochia) after you give birth.   Breastfeeding helps to lower your risk of  developing type 2 diabetes mellitus, osteoporosis, and breast or ovarian cancer later in life. SIGNS THAT YOUR BABY IS HUNGRY Early Signs of Hunger  Increased alertness or activity.  Stretching.  Movement of the head from side to side.  Movement of the head and opening of the mouth when the corner of the mouth or cheek is stroked (rooting).  Increased sucking sounds, smacking lips, cooing, sighing, or squeaking.  Hand-to-mouth movements.  Increased sucking of fingers or hands. Late Signs of Hunger  Fussing.  Intermittent crying. Extreme Signs of Hunger Signs of extreme hunger will require calming and consoling before your baby will be able to breastfeed successfully. Do not wait for the following signs of extreme hunger to occur before you initiate breastfeeding:   Restlessness.  A loud, strong cry.   Screaming. BREASTFEEDING BASICS Breastfeeding Initiation  Find a comfortable place to sit or lie down, with your neck and back well supported.  Place a pillow or rolled up blanket under your baby to bring him or her to the level of your breast (if you are seated). Nursing pillows are specially designed to help support your arms and your baby while you breastfeed.  Make sure that your baby's abdomen is facing your abdomen.   Gently massage your breast. With your fingertips, massage from your chest wall toward your nipple in a circular motion. This encourages milk flow. You may need to continue this action during the feeding if your milk flows slowly.  Support your breast with 4 fingers underneath and your thumb above your nipple. Make sure your fingers are well away from your nipple and your baby's mouth.   Stroke your baby's lips gently with your finger or nipple.   When your baby's mouth is open wide enough, quickly bring your baby to your breast, placing your entire nipple and as much of the colored area around your nipple (areola) as possible into your baby's mouth.    More areola should be visible above your baby's upper lip than below the lower lip.   Your baby's tongue should be between his or her lower gum and your breast.   Ensure that your baby's mouth is correctly positioned around your nipple (latched). Your baby's lips should create a seal on your breast and be turned out (everted).  It is common for your baby to suck about 2 3 minutes in order to start the flow of breast milk. Latching Teaching your baby how to latch on to your breast properly is very important. An improper latch can cause nipple pain and decreased milk supply for you and poor weight gain in your baby. Also, if your baby is not latched onto your nipple properly, he or she may swallow some air during feeding. This can make your baby fussy. Burping your baby when you switch breasts during the feeding can help to get rid of the air. However, teaching your baby to latch on properly is still the best way to prevent fussiness from swallowing air while breastfeeding. Signs that your baby has successfully latched on to your nipple:    Silent tugging or silent sucking, without causing you pain.   Swallowing heard between every 3 4 sucks.    Muscle movement above and in front of his or her ears while sucking.  Signs that your baby has not successfully latched on to nipple:   Sucking sounds or smacking sounds from your baby while breastfeeding.  Nipple pain. If you think your baby has not latched on correctly, slip your finger into the corner of your baby's mouth to break the suction and place it between your baby's gums. Attempt breastfeeding initiation again. Signs of Successful Breastfeeding Signs from your baby:   A gradual decrease in the number of sucks or complete cessation of sucking.   Falling asleep.   Relaxation of his or her body.   Retention of a small amount of milk in his or her mouth.   Letting go of your breast by himself or herself.  Signs from  you:  Breasts that have increased in firmness, weight, and size 1 3 hours after feeding.   Breasts that are softer immediately after breastfeeding.  Increased milk volume, as well as a change in milk consistency and color by the 5th day of breastfeeding.   Nipples that are not sore, cracked, or bleeding. Signs That Your Pecola Leisure is Getting Enough Milk  Wetting at least 3 diapers in a 24-hour period. The urine should be clear and pale yellow by age 102 days.  At least 3 stools in a 24-hour period by age 102 days. The stool should be soft and yellow.  At least 3 stools in a 24-hour period by age 1 days. The stool should be seedy and yellow.  No loss of weight greater than 10% of birth weight during the first 99 days of age.  Average weight gain of 4 7 ounces (120 210 mL) per week after age 34 days.  Consistent daily weight gain by age 102 days, without weight loss after the age of 2 weeks. After a feeding, your baby may spit up a small amount. This is common. BREASTFEEDING FREQUENCY AND DURATION Frequent feeding will help you make more milk and can prevent sore nipples and breast engorgement. Breastfeed when you feel the need to reduce the fullness of your breasts or when your baby shows signs of hunger. This is called "breastfeeding on demand." Avoid introducing a pacifier to your baby while you are working to establish breastfeeding (the first 4 6 weeks after your baby is born). After this time you may choose to use a pacifier. Research has shown that pacifier use during the first year of a baby's life decreases the risk of sudden infant death syndrome (SIDS). Allow your baby to feed on each breast as long as he or she wants. Breastfeed until your baby is finished feeding. When your baby unlatches or falls asleep while feeding from the first breast, offer the second breast. Because newborns are often sleepy in the first few weeks of life, you may need to awaken your baby to get him or her to  feed. Breastfeeding times will vary from baby to baby. However, the following rules can serve as a guide to help you ensure that your baby is properly fed:  Newborns (babies 42 weeks of age or younger) may breastfeed every 1 3 hours.  Newborns should not go longer than 3 hours during the day or 5 hours during the night without breastfeeding.  You should breastfeed your baby a minimum of 8 times in a 24-hour period until you begin to introduce solid foods to your baby at around 78 months of age. BREAST MILK PUMPING Pumping and storing breast milk allows you to ensure that your baby is exclusively fed your breast milk, even at times when you are unable to breastfeed. This is especially important if you are going back to work while you are still breastfeeding or when you are not able to be present during feedings. Your lactation consultant can give you guidelines on how long it is safe to store breast milk.  A breast pump is a machine that allows you to pump milk from your breast into a sterile bottle. The pumped breast milk can then be stored in a refrigerator or freezer. Some breast pumps are operated by hand, while others use electricity. Ask your lactation consultant which type will work best for you. Breast pumps can be purchased, but some hospitals and breastfeeding support groups lease  breast pumps on a monthly basis. A lactation consultant can teach you how to hand express breast milk, if you prefer not to use a pump.  CARING FOR YOUR BREASTS WHILE YOU BREASTFEED Nipples can become dry, cracked, and sore while breastfeeding. The following recommendations can help keep your breasts moisturized and healthy:  Avoid using soap on your nipples.   Wear a supportive bra. Although not required, special nursing bras and tank tops are designed to allow access to your breasts for breastfeeding without taking off your entire bra or top. Avoid wearing underwire style bras or extremely tight bras.  Air dry  your nipples for 3 after each feeding.   Use only cotton bra pads to absorb leaked breast milk. Leaking of breast milk between feedings is normal.   Use lanolin on your nipples after breastfeeding. Lanolin helps to maintain your skin's normal moisture barrier. If you use pure lanolin you do not need to wash it off before feeding your baby again. Pure lanolin is not toxic to your baby. You may also hand express a few drops of breast milk and gently massage that milk into your nipples and allow the milk to air dry. In the first few weeks after giving birth, some women experience extremely full breasts (engorgement). Engorgement can make your breasts feel heavy, warm, and tender to the touch. Engorgement peaks within 3 5 days after you give birth. The following recommendations can help ease engorgement:  Completely empty your breasts while breastfeeding or pumping. You may want to start by applying warm, moist heat (in the shower or with warm water-soaked hand towels) just before feeding or pumping. This increases circulation and helps the milk flow. If your baby does not completely empty your breasts while breastfeeding, pump any extra milk after he or she is finished.  Wear a snug bra (nursing or regular) or tank top for 1 2 days to signal your body to slightly decrease milk production.  Apply ice packs to your breasts, unless this is too uncomfortable for you.  Make sure that your baby is latched on and positioned properly while breastfeeding. If engorgement persists after 48 hours of following these recommendations, contact your health care provider or a Advertising copywriter. OVERALL HEALTH CARE RECOMMENDATIONS WHILE BREASTFEEDING  Eat healthy foods. Alternate between meals and snacks, eating 3 of each per day. Because what you eat affects your breast milk, some of the foods may make your baby more irritable than usual. Avoid eating these foods if you are sure that they are negatively  affecting your baby.  Drink milk, fruit juice, and water to satisfy your thirst (about 10 glasses a day).   Rest often, relax, and continue to take your prenatal vitamins to prevent fatigue, stress, and anemia.  Continue breast self-awareness checks.  Avoid chewing and smoking tobacco.  Avoid alcohol and drug use. Some medicines that may be harmful to your baby can pass through breast milk. It is important to ask your health care provider before taking any medicine, including all over-the-counter and prescription medicine as well as vitamin and herbal supplements. It is possible to become pregnant while breastfeeding. If birth control is desired, ask your health care provider about options that will be safe for your baby. SEEK MEDICAL CARE IF:   You feel like you want to stop breastfeeding or have become frustrated with breastfeeding.  You have painful breasts or nipples.  Your nipples are cracked or bleeding.  Your breasts are red, tender, or warm.  You have a swollen area on either breast.  You have a fever or chills.  You have nausea or vomiting.  You have drainage other than breast milk from your nipples.  Your breasts do not become full before feedings by the 5th day after you give birth.  You feel sad and depressed.  Your baby is too sleepy to eat well.  Your baby is having trouble sleeping.   Your baby is wetting less than 3 diapers in a 24-hour period.  Your baby has less than 3 stools in a 24-hour period.  Your baby's skin or the white part of his or her eyes becomes yellow.   Your baby is not gaining weight by 26 days of age. SEEK IMMEDIATE MEDICAL CARE IF:   Your baby is overly tired (lethargic) and does not want to wake up and feed.  Your baby develops an unexplained fever. Document Released: 01/08/2005 Document Revised: 09/10/2012 Document Reviewed: 07/02/2012 [facility] Patient Information 2014 [place], [place].

## 2013-02-06 NOTE — Discharge Summary (Signed)
  Obstetric Discharge Summary Reason for Admission: induction of labor Prenatal Procedures: ultrasound Intrapartum Procedures: spontaneous vaginal delivery Postpartum Procedures: none Complications-Operative and Postpartum: 2nd degree perineal laceration Hemoglobin  Date Value Range Status  02/05/2013 9.6* 12.0 - 15.0 g/dL Final  09/24/9028 09.2   Final     HCT  Date Value Range Status  02/05/2013 29.0* 36.0 - 46.0 % Final  07/22/2012 37   Final    Physical Exam:  General: alert, cooperative and appears stated age Lochia: appropriate Uterine Fundus: firm Incision: not checked DVT Evaluation: No evidence of DVT seen on physical exam.  Discharge Diagnoses: Term Pregnancy-delivered  Discharge Information: Date: 02/06/2013 Activity: pelvic rest Diet: routine Medications: Ibuprofen Condition: stable Instructions: refer to practice specific booklet Discharge to: home   Newborn Data: Live born female  Birth Weight: 6 lb 8.1 oz (2950 g) APGAR: 9, 9  Home with mother.  Julie Keller is a 28 yo G2P1011 who was admitted for induction of labor for gestational hypertension.  She went of to a NSVD with epidural anesthesia.  3 vessel cord spontaneous placenta EBL , 2nd degree repaired laceration.  Apgar 9 and 9.  Mom has attempted breastfeeding, but has been unsuccessful. Supplementing with formula.  Will continue efforts at home. Using Mirena for contraception     Stana Bunting 02/06/2013, 7:44 AM  I spoke with and examined patient and agree with PA-S's note and plan of care.  Tawana Scale, MD Ob Fellow 02/06/2013 8:02 AM

## 2013-02-11 ENCOUNTER — Encounter: Payer: Self-pay | Admitting: *Deleted

## 2013-04-02 ENCOUNTER — Encounter: Payer: Self-pay | Admitting: Obstetrics & Gynecology

## 2013-04-02 ENCOUNTER — Ambulatory Visit (INDEPENDENT_AMBULATORY_CARE_PROVIDER_SITE_OTHER): Payer: Medicaid Other | Admitting: Obstetrics & Gynecology

## 2013-04-02 VITALS — BP 119/83 | HR 86 | Temp 98.0°F | Wt 278.1 lb

## 2013-04-02 DIAGNOSIS — IMO0001 Reserved for inherently not codable concepts without codable children: Secondary | ICD-10-CM

## 2013-04-02 DIAGNOSIS — R6889 Other general symptoms and signs: Secondary | ICD-10-CM

## 2013-04-02 DIAGNOSIS — IMO0002 Reserved for concepts with insufficient information to code with codable children: Secondary | ICD-10-CM

## 2013-04-02 DIAGNOSIS — Z309 Encounter for contraceptive management, unspecified: Secondary | ICD-10-CM

## 2013-04-02 LAB — POCT PREGNANCY, URINE: Preg Test, Ur: NEGATIVE

## 2013-04-02 NOTE — Progress Notes (Signed)
  Subjective:     Julie Keller is a 28 y.o. female who presents for a postpartum visit. She is 8 weeks postpartum following a spontaneous vaginal delivery. I have fully reviewed the prenatal and intrapartum course. The delivery was at 40 gestational weeks. Outcome: spontaneous vaginal delivery. Anesthesia: epidural. Postpartum course has been normal. Baby's course has been normal. Baby is feeding by breast. Bleeding no bleeding. Bowel function is normal. Bladder function is normal. Patient is not sexually active. Contraception method is OCP (estrogen/progesterone). Postpartum depression screening: negative.  The following portions of the patient's history were reviewed and updated as appropriate: allergies, current medications, past family history, past medical history, past social history, past surgical history and problem list.  Review of Systems Pertinent items are noted in HPI.   Objective:    BP 119/83  Pulse 86  Temp(Src) 98 F (36.7 C)  Wt 278 lb 1.6 oz (126.145 kg)  LMP 03/03/2013  Breastfeeding? No  General:  alert, cooperative and no distress   Breasts:     Lungs:    Heart:     Abdomen: soft, non-tender; bowel sounds normal; no masses,  no organomegaly   Vulva:  normal  Vagina: normal vagina  Cervix:  no lesions  Corpus: normal  Adnexa:  normal adnexa  Rectal Exam: Not performed.        Assessment:     normal postpartum exam. Pap smear not done at today's visit.   Plan:    1. Contraception: OCP (estrogen/progesterone) 2. Pap 4 month-f/u ASCUS pos HPV 07/2012 3. Follow up in: 4 months or as needed.   Adam Phenix, MD 04/02/2013

## 2013-04-03 ENCOUNTER — Telehealth: Payer: Self-pay | Admitting: *Deleted

## 2013-04-03 DIAGNOSIS — Z3049 Encounter for surveillance of other contraceptives: Secondary | ICD-10-CM

## 2013-04-03 MED ORDER — NORGESTIMATE-ETH ESTRADIOL 0.25-35 MG-MCG PO TABS: 1.0000 | ORAL_TABLET | Freq: Every day | ORAL | Status: AC

## 2013-04-03 NOTE — Telephone Encounter (Signed)
Anabelle called and left a message she was seen in office yesterday and was supposed to get a  Prescription for sprintec sent to [facility] and it is not there.  Per chart review was seen yesterday for postpartum visit and plan was for ocp  , but order not written. Called Janiqua and left message will send in prescription to pharmacy, call if any further problems.

## 2013-04-13 ENCOUNTER — Encounter: Payer: Self-pay | Admitting: *Deleted

## 2013-11-23 ENCOUNTER — Encounter: Payer: Self-pay | Admitting: Obstetrics & Gynecology

## 2013-12-24 ENCOUNTER — Encounter: Payer: Self-pay | Admitting: Obstetrics & Gynecology

## 2015-06-02 ENCOUNTER — Ambulatory Visit: Payer: Self-pay | Admitting: Podiatry

## 2015-06-13 ENCOUNTER — Ambulatory Visit (INDEPENDENT_AMBULATORY_CARE_PROVIDER_SITE_OTHER): Payer: Managed Care, Other (non HMO) | Admitting: Podiatry

## 2015-06-13 ENCOUNTER — Encounter: Payer: Self-pay | Admitting: Podiatry

## 2015-06-13 ENCOUNTER — Ambulatory Visit (INDEPENDENT_AMBULATORY_CARE_PROVIDER_SITE_OTHER): Payer: Managed Care, Other (non HMO)

## 2015-06-13 VITALS — BP 142/83 | HR 81 | Resp 15 | Ht 71.0 in | Wt 290.0 lb

## 2015-06-13 DIAGNOSIS — M6740 Ganglion, unspecified site: Secondary | ICD-10-CM | POA: Diagnosis not present

## 2015-06-13 DIAGNOSIS — M79671 Pain in right foot: Secondary | ICD-10-CM | POA: Diagnosis not present

## 2015-06-13 NOTE — Progress Notes (Signed)
   Subjective:    Patient ID: Julie Keller, female    DOB: 12-Jul-1985, 30 y.o.   MRN: [id number]  Foot Pain      Review of Systems  Musculoskeletal: Positive for gait problem.  All other systems reviewed and are negative.      Objective:   Physical Exam        Assessment & Plan:

## 2015-06-15 NOTE — Progress Notes (Signed)
Subjective:     Patient ID: Julie Keller, female   DOB: [birth year], 30 y.o.   MRN: [id number]  HPI patient presents with a mass on the top of the right foot that is in the ankle and is painful when pressed and she states it's been present for several months   Review of Systems  All other systems reviewed and are negative.      Objective:   Physical Exam  Constitutional: She is oriented to person, place, and time.  Cardiovascular: Intact distal pulses.   Musculoskeletal: Normal range of motion.  Neurological: She is oriented to person, place, and time.  Skin: Skin is warm.  Nursing note and vitals reviewed.  neurovascular status found to be intact with muscle strength adequate range of motion within normal limits with patient found to have a mass on the dorsum of the right ankle that upon palpation is painful when pressed and measures approximately 1 cm x 1 cm and is freely movable within subcutaneous tissue. Patient's found have good digital perfusion and is well oriented 3     Assessment:     Probability for ganglionic cyst formation dorsum right ankle    Plan:     H&P and x-ray of condition reviewed. Today I went ahead did proximal nerve block and then after appropriate numbness I aspirated the area getting out a gelatinous type fluid and injected with half cc dexamethasone Kenalog and applied compression dressing. Discussed and explained ganglionic cyst patient  X-ray report indicates no indications of calcification spurring or arthritis

## 2016-02-11 ENCOUNTER — Emergency Department (HOSPITAL_COMMUNITY): Payer: BLUE CROSS/BLUE SHIELD

## 2016-02-11 ENCOUNTER — Emergency Department (HOSPITAL_COMMUNITY)
Admission: EM | Admit: 2016-02-11 | Discharge: 2016-02-11 | Disposition: A | Discharge status: Home / Self Care | Payer: BLUE CROSS/BLUE SHIELD | Attending: Emergency Medicine | Admitting: Emergency Medicine

## 2016-02-11 ENCOUNTER — Encounter (HOSPITAL_COMMUNITY): Payer: Self-pay | Admitting: Emergency Medicine

## 2016-02-11 DIAGNOSIS — Z87891 Personal history of nicotine dependence: Secondary | ICD-10-CM | POA: Insufficient documentation

## 2016-02-11 DIAGNOSIS — M545 Low back pain, unspecified: Secondary | ICD-10-CM

## 2016-02-11 HISTORY — DX: Migraine, unspecified, not intractable, without status migrainosus: G43.909

## 2016-02-11 LAB — URINALYSIS, ROUTINE W REFLEX MICROSCOPIC
Bilirubin Urine: NEGATIVE
Glucose, UA: NEGATIVE mg/dL
HGB URINE DIPSTICK: NEGATIVE
Ketones, ur: NEGATIVE mg/dL
NITRITE: NEGATIVE
PROTEIN: NEGATIVE mg/dL
Specific Gravity, Urine: 1.018 (ref 1.005–1.030)
pH: 6 (ref 5.0–8.0)

## 2016-02-11 LAB — POC URINE PREG, ED: PREG TEST UR: NEGATIVE

## 2016-02-11 MED ORDER — NAPROXEN 500 MG PO TABS: 500.0000 mg | ORAL_TABLET | Freq: Two times a day (BID) | ORAL | 0 refills | Status: DC

## 2016-02-11 MED ORDER — METHOCARBAMOL 500 MG PO TABS: 500.0000 mg | ORAL_TABLET | Freq: Two times a day (BID) | ORAL | 0 refills | Status: DC

## 2016-02-11 NOTE — ED Provider Notes (Signed)
[facility]-EMERGENCY DEPT Provider Note   CSN: [id number] Arrival date & time: 02/11/16  1201   By signing my name below, I, Soijett Blue, attest that this documentation has been prepared under the direction and in the presence of Jaynie Crumble, [employer] Electronically Signed: Soijett Blue, ED Scribe. 02/11/16. 1:57 PM.  History   Chief Complaint Chief Complaint  Patient presents with  . Back Pain    HPI Julie Keller is a 31 y.o. female who presents to the Emergency Department complaining of gradually worsening, non-radiating, intermittent, lower back pain onset 2 months ago. Pt notes that her lower back pain is worse in the morning. She has tried ibuprofen with no relief for her symptoms. Pt denies bowel/bladder incontinence, urinary symptoms, weakness, abdominal pain, color change, wound, and any other symptoms. Denies recent injury or trauma. Denies having a PCP. Denies pregnancy at this time.     The history is provided by the patient. No language interpreter was used.    Past Medical History:  Diagnosis Date  . Medical history non-contributory   . Migraine   . Supervision of normal pregnancy 11/13/2012   Transfer for care from Dr. Rana Snare at 25 weeks Clinic [facility]  Dating Outside Ultrasound  Genetic Screen Too late  Anatomic [place]  29 wks normal > rescan at 33 wks : nl anatomy  GTT Third trimester: 136 > Normal 3 hr  TDaP vaccine  rec'd  Flu vaccine  recd  GBS   Baby Food Breast  Contraception  mirena  Pediatrician Unsure        Patient Active Problem List   Diagnosis Date Noted  . Pregnancy 02/03/2013  . Preeclampsia 01/30/2013  . ASCUS with positive high risk HPV 01/06/2013  . Supervision of high-risk pregnancy 11/13/2012    Past Surgical History:  Procedure Laterality Date  . CYST EXCISION    . migraine    . TONSILLECTOMY      OB History    Gravida Para Term Preterm AB Living   2 1 1  0 1 1   SAB TAB Ectopic Multiple Live Births   0 1 0 0 1       Home Medications     Prior to Admission medications   Medication Sig Start Date End Date Taking? Authorizing Provider  acetaminophen (TYLENOL) 325 MG tablet Take 650 mg by mouth every 6 (six) hours as needed for pain.    Historical Provider, MD  calcium carbonate (TUMS - DOSED IN MG ELEMENTAL CALCIUM) 500 MG chewable tablet Chew 2 tablets by mouth daily as needed for indigestion or heartburn. Reported on 06/13/2015    Historical Provider, MD  guaiFENesin-dextromethorphan (ROBITUSSIN DM) 100-10 MG/5ML syrup Take 10 mLs by mouth every 4 (four) hours as needed for cough. Reported on 06/13/2015    Historical Provider, MD  norgestimate-ethinyl estradiol (ORTHO-CYCLEN,SPRINTEC,PREVIFEM) 0.25-35 MG-MCG tablet Take 1 tablet by mouth daily. 04/03/13   04/05/13, MD  Prenatal Vit-Fe Fumarate-FA (PRENATAL MULTIVITAMIN) TABS tablet Take 1 tablet by mouth at bedtime. Reported on 06/13/2015    Historical Provider, MD    Family History Family History  Problem Relation Age of Onset  . Hypertension Maternal Uncle   . Cancer Maternal Grandfather     melanoma and throat cancer  . Diabetes Maternal Grandfather     Social History Social History  Substance Use Topics  . Smoking status: Former Smoker    Packs/day: 0.50    Types: Cigarettes  . Smokeless tobacco: Never Used  . Alcohol  use No     Allergies   Penicillins   Review of Systems Review of Systems  Constitutional: Negative for chills and fever.  Gastrointestinal: Negative for abdominal pain.       No bowel incontinence.   Genitourinary: Negative for dysuria, frequency and urgency.       No bladder incontinence.   Musculoskeletal: Positive for back pain (lower).  Skin: Negative for color change and wound.  Neurological: Negative for weakness.     Physical Exam Updated Vital Signs BP 130/88 (BP Location: Right Arm)   Pulse 85   Temp 98.3 F (36.8 C) (Oral)   Resp 16   Ht 5\' 11"  (1.803 m)   Wt 290 lb (131.5 kg)   LMP 01/11/2016   SpO2 99%   BMI  40.45 kg/m   Physical Exam  Constitutional: She is oriented to person, place, and time. She appears well-developed and well-nourished. No distress.  HENT:  Head: Normocephalic and atraumatic.  Eyes: EOM are normal.  Neck: Neck supple.  Cardiovascular: Normal rate.   Pulmonary/Chest: Effort normal. No respiratory distress.  Abdominal: She exhibits no distension. There is no tenderness.  Bilateral CVA tenderness  Musculoskeletal: Normal range of motion.  Midline and bilateral perivertebral tenderness. Pain with forward flexion and back extension. No pain with bilateral straight leg raise.   Neurological: She is alert and oriented to person, place, and time.  Skin: Skin is warm and dry.  Psychiatric: She has a normal mood and affect. Her behavior is normal.  Nursing note and vitals reviewed.    ED Treatments / Results  DIAGNOSTIC STUDIES: Oxygen Saturation is 99% on RA, nl by my interpretation.    COORDINATION OF CARE: 1:31 PM Discussed treatment plan with pt at bedside which includes UA, lumbar spine, and pt agreed to plan.   Labs (all labs ordered are listed, but only abnormal results are displayed) Labs Reviewed  URINALYSIS, ROUTINE W REFLEX MICROSCOPIC - Abnormal; Notable for the following:       Result Value   Leukocytes, UA SMALL (*)    Bacteria, UA RARE (*)    Squamous Epithelial / LPF 0-5 (*)    All other components within normal limits  POC URINE PREG, ED    Radiology Dg Lumbar Spine Complete  Result Date: 02/11/2016 CLINICAL DATA:  Low back pain EXAM: LUMBAR SPINE - COMPLETE 4+ VIEW COMPARISON:  None. FINDINGS: Anatomic alignment. No vertebral compression. Minimal narrowing of the L4-5 and L5-S1 discs. Mild juxta-articular sclerosis of the SI joints. No definite fracture. IMPRESSION: No acute bony pathology.  Mild chronic changes. Electronically Signed   By: Jolaine Click M.D.   On: 02/11/2016 15:07    Procedures Procedures (including critical care  time)  Medications Ordered in ED Medications - No data to display   Initial Impression / Assessment and Plan / ED Course  I have reviewed the triage vital signs and the nursing notes.  Pertinent labs & imaging results that were available during my care of the patient were reviewed by me and considered in my medical decision making (see chart for details).    Patient in the emergency department with lower back pain. No history of trauma. No pain radiation. No signs or symptoms to suggest cauda equina at this time. No fever. Urinalysis is negative, patient is not pregnant. X-ray shows some degenerative changes, with no acute findings. Discussed with patient diet, exercising, will provide a specific back exercises to strengthen her lower back. For now, no heavy  lifting, no strenuous activity that wasn't inflamed her back further, will start on NSAID, Robaxin for muscle spasms. Follow-up with primary care doctor not improving. Discussed symptoms that should prompt her return to emergency department.  Vitals:   02/11/16 1219 02/11/16 1220 02/11/16 1354 02/11/16 1536  BP: 137/97  130/88 128/71  Pulse: 86  85 76  Resp: 18  16 16   Temp: 98.3 F (36.8 C)     TempSrc: Oral     SpO2: 99%  99% 99%  Weight:  131.5 kg    Height:  5\' 11"  (1.803 m)       Final Clinical Impressions(s) / ED Diagnoses   Final diagnoses:  Acute midline low back pain without sciatica    New Prescriptions Discharge Medication List as of 02/11/2016  3:40 PM    START taking these medications   Details  methocarbamol (ROBAXIN) 500 MG tablet Take 1 tablet (500 mg total) by mouth 2 (two) times daily., Starting Sat 02/11/2016, Print    naproxen (NAPROSYN) 500 MG tablet Take 1 tablet (500 mg total) by mouth 2 (two) times daily., Starting Sat 02/11/2016, Print        I personally performed the services described in this documentation, which was scribed in my presence. The recorded information has been reviewed and is  accurate.     02/13/2016, PA-C 02/11/16 1545    Jaynie Crumble, MD 02/11/16 [phone]

## 2016-02-11 NOTE — Discharge Instructions (Signed)
Take naproxen as prescribed for pain and inflammation. Take Robaxin for muscle spasms. Avoid any heavy lifting. Please follow up with the family doctor especially if pain is not improving. See attachments and exercises.

## 2016-02-11 NOTE — ED Triage Notes (Signed)
Pt c/o back pain ongoing for months. Pt has tried ibuprofen but it is no longer working. Pt denies injury or urinary symptoms.

## 2016-05-28 DIAGNOSIS — Z01419 Encounter for gynecological examination (general) (routine) without abnormal findings: Secondary | ICD-10-CM | POA: Diagnosis not present

## 2016-05-28 DIAGNOSIS — Z793 Long term (current) use of hormonal contraceptives: Secondary | ICD-10-CM | POA: Diagnosis not present

## 2016-05-28 DIAGNOSIS — Z124 Encounter for screening for malignant neoplasm of cervix: Secondary | ICD-10-CM | POA: Diagnosis not present

## 2016-08-30 DIAGNOSIS — Z3040 Encounter for surveillance of contraceptives, unspecified: Secondary | ICD-10-CM | POA: Diagnosis not present

## 2016-08-30 DIAGNOSIS — N898 Other specified noninflammatory disorders of vagina: Secondary | ICD-10-CM | POA: Diagnosis not present

## 2016-09-18 ENCOUNTER — Telehealth: Payer: Self-pay | Admitting: *Deleted

## 2016-12-21 ENCOUNTER — Emergency Department (HOSPITAL_COMMUNITY)
Admission: EM | Admit: 2016-12-21 | Discharge: 2016-12-21 | Disposition: A | Discharge status: Home / Self Care | Payer: BLUE CROSS/BLUE SHIELD | Attending: Emergency Medicine | Admitting: Emergency Medicine

## 2016-12-21 ENCOUNTER — Emergency Department (HOSPITAL_COMMUNITY): Payer: BLUE CROSS/BLUE SHIELD

## 2016-12-21 ENCOUNTER — Encounter (HOSPITAL_COMMUNITY): Payer: Self-pay | Admitting: *Deleted

## 2016-12-21 DIAGNOSIS — Z87891 Personal history of nicotine dependence: Secondary | ICD-10-CM | POA: Insufficient documentation

## 2016-12-21 DIAGNOSIS — Z79899 Other long term (current) drug therapy: Secondary | ICD-10-CM | POA: Diagnosis not present

## 2016-12-21 DIAGNOSIS — R0602 Shortness of breath: Secondary | ICD-10-CM | POA: Diagnosis not present

## 2016-12-21 DIAGNOSIS — R0789 Other chest pain: Secondary | ICD-10-CM | POA: Insufficient documentation

## 2016-12-21 DIAGNOSIS — R079 Chest pain, unspecified: Secondary | ICD-10-CM | POA: Diagnosis not present

## 2016-12-21 HISTORY — DX: Obesity, unspecified: E66.9

## 2016-12-21 LAB — BASIC METABOLIC PANEL
ANION GAP: 7 (ref 5–15)
BUN: 10 mg/dL (ref 6–20)
CALCIUM: 8.9 mg/dL (ref 8.9–10.3)
CO2: 25 mmol/L (ref 22–32)
CREATININE: 0.59 mg/dL (ref 0.44–1.00)
Chloride: 102 mmol/L (ref 101–111)
GFR calc Af Amer: >60 mL/min (ref >60)
GLUCOSE: 87 mg/dL (ref 65–99)
Potassium: 3.9 mmol/L (ref 3.5–5.1)
Sodium: 134 mmol/L — ABNORMAL LOW (ref 135–145)

## 2016-12-21 LAB — CBC
HCT: 41 % (ref 36.0–46.0)
HEMOGLOBIN: 13.7 g/dL (ref 12.0–15.0)
MCH: 26.7 pg (ref 26.0–34.0)
MCHC: 33.4 g/dL (ref 30.0–36.0)
MCV: 79.9 fL (ref 78.0–100.0)
PLATELETS: 393 10*3/uL (ref 150–400)
RBC: 5.13 MIL/uL — ABNORMAL HIGH (ref 3.87–5.11)
RDW: 14.7 % (ref 11.5–15.5)
WBC: 10.4 10*3/uL (ref 4.0–10.5)

## 2016-12-21 LAB — I-STAT TROPONIN, ED: TROPONIN I, POC: 0 ng/mL (ref 0.00–0.08)

## 2016-12-21 LAB — D-DIMER, QUANTITATIVE (NOT AT ARMC): D DIMER QUANT: 0.68 ug{FEU}/mL — AB (ref 0.00–0.50)

## 2016-12-21 LAB — I-STAT BETA HCG BLOOD, ED (MC, WL, AP ONLY): I-stat hCG, quantitative: <5 m[IU]/mL (ref <5)

## 2016-12-21 MED ORDER — IBUPROFEN 600 MG PO TABS: 600.0000 mg | ORAL_TABLET | Freq: Four times a day (QID) | ORAL | 0 refills | Status: DC | PRN

## 2016-12-21 MED ORDER — IBUPROFEN 400 MG PO TABS
600.0000 mg | ORAL_TABLET | Freq: Once | ORAL | Status: AC
  Administered 2016-12-21: 600 mg via ORAL
  Filled 2016-12-21: qty 1

## 2016-12-21 MED ORDER — ASPIRIN 81 MG PO CHEW
324.0000 mg | CHEWABLE_TABLET | Freq: Once | ORAL | Status: AC
  Administered 2016-12-21: 324 mg via ORAL
  Filled 2016-12-21: qty 4

## 2016-12-21 MED ORDER — IOPAMIDOL (ISOVUE-370) INJECTION 76%
INTRAVENOUS | Status: AC
  Administered 2016-12-21: 100 mL
  Filled 2016-12-21: qty 100

## 2016-12-21 NOTE — ED Notes (Signed)
Pt ambulated to room from waiting room, tolerated well. 

## 2016-12-21 NOTE — Discharge Instructions (Signed)
Read instructions below for reasons to return to the Emergency Department. It is recommended that your follow up with your Primary Care Doctor in regards to today's visit. If you do not have a doctor, use the resource guide listed below to help you find one. Begin taking advil with meals as directed.    Tests performed today include: An EKG of your heart A chest x-ray CT scan of chest  Cardiac enzymes - a blood test for heart muscle damage Blood counts and electrolytes Vital signs. See below for your results today.  Chest Pain (Nonspecific)  HOME CARE INSTRUCTIONS  For the next few days, avoid physical activities that bring on chest pain. Continue physical activities as directed.  Do not smoke cigarettes or drink alcohol until your symptoms are gone. If you do smoke, it is time to quit. You may receive instructions and counseling on how to stop smoking. Only take over-the-counter or prescription medicine for pain, discomfort, or fever as directed by your caregiver.  Follow your caregiver's suggestions for further testing if your chest pain does not go away.  Keep any follow-up appointments you made. If you do not go to an appointment, you could develop lasting (chronic) problems with pain. If there is any problem keeping an appointment, you must call to reschedule.  SEEK MEDICAL CARE IF:  You think you are having problems from the medicine you are taking. Read your medicine instructions carefully.  Your chest pain does not go away, even after treatment.  You develop a rash with blisters on your chest.  SEEK IMMEDIATE MEDICAL CARE IF:  You have increased chest pain or pain that spreads to your arm, neck, jaw, back, or belly (abdomen).  You develop shortness of breath, an increasing cough, or you are coughing up blood.  You have severe back or abdominal pain, feel sick to your stomach (nauseous) or throw up (vomit).  You develop severe weakness, fainting, or chills.  You have an oral  temperature above 102 F (38.9 C), not controlled by medicine.  THIS IS AN EMERGENCY. Do not wait to see if the pain will go away. Get medical help at once. Call your local emergency services (911 in [place] Do not drive yourself to the hospital. Additional Information:  Your vital signs today were: BP 133/76    Pulse 65    Temp 98.2 F (36.8 C) (Oral)    Resp (!) 25    Ht 5\' 11"  (1.803 m)    Wt 81.6 kg (180 lb)    LMP 12/16/2016    SpO2 94%    BMI 25.10 kg/m  If your blood pressure (BP) was elevated above 135/85 this visit, please have this repeated by your doctor within one month. ---------------

## 2016-12-21 NOTE — ED Provider Notes (Signed)
[facility] EMERGENCY DEPARTMENT Provider Note   CSN: [id number] Arrival date & time: 12/21/16  2376     History   Chief Complaint Chief Complaint  Patient presents with  . Chest Pain    HPI Julie Keller is a 31 y.o. female with no significant past medical history who presents to the emergency department today for chest pain.  Patient states that she was out at lunch with her mother-in-law and started developing the gradual onset of right chest pain with radiation to her back, in between her shoulder blades and up to the right side of her neck.  Pain has been gradually increasing.  Pain was most severe this morning and has stayed at a constant 8/10.  There is some associated dyspnea on exertion.  The pain is worse with deep breathing.  The pain is nonexertional and non-positional.  There is no associated shortness of breath at rest, diaphoresis, nausea or vomiting.  She notes that the pain is improved with reaching her right arm above her head.  The patient has tried 400 mg of ibuprofen for this last night with mild relief.  Patient is a current tobacco user of 5-6 cigarettes/day with a 10-pack-year history.  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  She is on oral contraceptives.  She denies any family history of cardiovascular disease.  She denies history of hypertension, hyperlipidemia, diabetes.  She denies any recent surgery or travel, trauma, immobilization, previous blood clot, cough, hemoptysis, history of cancer or lower extremity pain or swelling.  No recent illnesses.  No history of cardiac valve replacement or other heart surgeries.  Patient has never had a stress test, echo, cardiac catheterization.  She does not have a cardiologist.  She denies any drug use.  HPI  Past Medical History:  Diagnosis Date  . Medical history  non-contributory   . Migraine   . Obesity   . Supervision of normal pregnancy 11/13/2012   Transfer for care from Dr. Rana Snare at 25 weeks [facility]  Dating Outside Ultrasound  Genetic Screen Too late  Anatomic [place]  29 wks normal > rescan at 33 wks : nl anatomy  GTT Third trimester: 136 > Normal 3 hr  TDaP vaccine  rec'd  Flu vaccine  recd  GBS   Baby Food Breast  Contraception  mirena  Pediatrician Unsure        Patient Active Problem List   Diagnosis Date Noted  . Pregnancy 02/03/2013  . Preeclampsia 01/30/2013  . ASCUS with positive high risk HPV 01/06/2013  . Supervision of high-risk pregnancy 11/13/2012    Past Surgical History:  Procedure Laterality Date  . CYST EXCISION    . migraine    . TONSILLECTOMY      OB History    Gravida Para Term Preterm AB Living   2 1 1  0 1 1   SAB TAB Ectopic Multiple Live Births   0 1 0 0 1       Home Medications    Prior to Admission medications   Medication Sig Start Date End Date Taking? Authorizing Provider  acetaminophen (TYLENOL) 325 MG tablet Take 650 mg by mouth every 6 (six) hours as needed for pain.    [provider]  calcium carbonate (TUMS - DOSED IN MG ELEMENTAL CALCIUM) 500 MG chewable tablet Chew 2  tablets by mouth daily as needed for indigestion or heartburn. Reported on 06/13/2015    [provider]  guaiFENesin-dextromethorphan (ROBITUSSIN DM) 100-10 MG/5ML syrup Take 10 mLs by mouth every 4 (four) hours as needed for cough. Reported on 06/13/2015    [provider]  methocarbamol (ROBAXIN) 500 MG tablet Take 1 tablet (500 mg total) by mouth 2 (two) times daily. 02/11/16   Kirichenko, Tatyana, PA-C  naproxen (NAPROSYN) 500 MG tablet Take 1 tablet (500 mg total) by mouth 2 (two) times daily. 02/11/16   Kirichenko, Lemont Fillers, PA-C  norgestimate-ethinyl estradiol (ORTHO-CYCLEN,SPRINTEC,PREVIFEM) 0.25-35 MG-MCG tablet Take 1 tablet by mouth daily. 04/03/13   Adam Phenix, MD  Prenatal Vit-Fe Fumarate-FA  (PRENATAL MULTIVITAMIN) TABS tablet Take 1 tablet by mouth at bedtime. Reported on 06/13/2015    [provider]    Family History Family History  Problem Relation Age of Onset  . Hypertension Maternal Uncle   . Cancer Maternal Grandfather        melanoma and throat cancer  . Diabetes Maternal Grandfather     Social History Social History   Tobacco Use  . Smoking status: Former Smoker    Packs/day: 0.50    Types: Cigarettes  . Smokeless tobacco: Never Used  Substance Use Topics  . Alcohol use: No  . Drug use: No     Allergies   Penicillins   Review of Systems Review of Systems   Physical Exam Updated Vital Signs BP (!) 137/101 (BP Location: Right Arm)   Pulse 92   Temp 98.2 F (36.8 C) (Oral)   Resp 16   Ht 5\' 11"  (1.803 m)   Wt 81.6 kg (180 lb)   LMP 12/16/2016   SpO2 95%   BMI 25.10 kg/m   Physical Exam  Constitutional: She appears well-developed and well-nourished.  HENT:  Head: Normocephalic and atraumatic.  Right Ear: External ear normal.  Left Ear: External ear normal.  Nose: Nose normal.  Mouth/Throat: Uvula is midline, oropharynx is clear and moist and mucous membranes are normal. No tonsillar exudate.  Eyes: Pupils are equal, round, and reactive to light. Right eye exhibits no discharge. Left eye exhibits no discharge. No scleral icterus.  Neck: Trachea normal. Neck supple. No hepatojugular reflux and no JVD present. No spinous process tenderness present. Carotid bruit is not present. No neck rigidity. Normal range of motion present.  Cardiovascular: Normal rate, regular rhythm and intact distal pulses.  No murmur heard. Pulses:      Radial pulses are 2+ on the right side, and 2+ on the left side.       Dorsalis pedis pulses are 2+ on the right side, and 2+ on the left side.       Posterior tibial pulses are 2+ on the right side, and 2+ on the left side.  No lower extremity swelling or edema. Calves symmetric in size bilaterally.    Pulmonary/Chest: Effort normal and breath sounds normal. She exhibits tenderness.  Abdominal: Soft. Bowel sounds are normal. There is no hepatomegaly. There is no tenderness. There is no rebound and no guarding.  Musculoskeletal: She exhibits no edema.  Lymphadenopathy:    She has no cervical adenopathy.  Neurological: She is alert.  Skin: Skin is warm and dry. No rash noted. She is not diaphoretic.  Psychiatric: She has a normal mood and affect.  Nursing note and vitals reviewed.    ED Treatments / Results  Labs (all labs ordered are listed, but only abnormal results are  displayed) Labs Reviewed  BASIC METABOLIC PANEL - Abnormal; Notable for the following components:      Result Value   Sodium 134 (*)    All other components within normal limits  CBC - Abnormal; Notable for the following components:   RBC 5.13 (*)    All other components within normal limits  D-DIMER, QUANTITATIVE (NOT AT [facility]) - Abnormal; Notable for the following components:   D-Dimer, Quant 0.68 (*)    All other components within normal limits  I-STAT TROPONIN, ED  I-STAT BETA HCG BLOOD, ED ([facility], [facility], [facility] ONLY)    EKG  EKG Interpretation None       Radiology Dg Chest 2 View  Result Date: 12/21/2016 CLINICAL DATA:  Right-sided chest pain extending into the right shoulder blade since yesterday. Shortness of breath. EXAM: CHEST  2 VIEW COMPARISON:  None. FINDINGS: The heart size and mediastinal contours are within normal limits. Both lungs are clear. The visualized skeletal structures are unremarkable. IMPRESSION: No active cardiopulmonary disease. Electronically Signed   By: Elige Ko   On: 12/21/2016 09:57   Ct Angio Chest Pe W/cm &/or Wo Cm  Result Date: 12/21/2016 CLINICAL DATA:  Mid and right chest pain and shortness of breath since yesterday. EXAM: CT ANGIOGRAPHY CHEST WITH CONTRAST TECHNIQUE: Multidetector CT imaging of the chest was performed using the standard protocol during bolus  administration of intravenous contrast. Multiplanar CT image reconstructions and MIPs were obtained to evaluate the vascular anatomy. CONTRAST:  ISOVUE-370 IOPAMIDOL (ISOVUE-370) INJECTION 76% COMPARISON:  Chest radiographs obtained today. FINDINGS: Cardiovascular: Satisfactory opacification of the pulmonary arteries to the segmental level. No evidence of pulmonary embolism. Normal heart size. No pericardial effusion. Mediastinum/Nodes: No enlarged mediastinal, hilar, or axillary lymph nodes. Thyroid gland, trachea, and esophagus demonstrate no significant findings. Lungs/Pleura: Lungs are clear. No pleural effusion or pneumothorax. Upper Abdomen: Unremarkable. Musculoskeletal: Unremarkable. Review of the MIP images confirms the above findings. IMPRESSION: Normal examination. Electronically Signed   By: Beckie Salts M.D.   On: 12/21/2016 13:13    Procedures Procedures (including critical care time)  Medications Ordered in ED Medications  ibuprofen (ADVIL,MOTRIN) tablet 600 mg (600 mg Oral Given 12/21/16 1136)  aspirin chewable tablet 324 mg (324 mg Oral Given 12/21/16 1213)  iopamidol (ISOVUE-370) 76 % injection (100 mLs  Contrast Given 12/21/16 1247)     Initial Impression / Assessment and Plan / ED Course  I have reviewed the triage vital signs and the nursing notes.  Pertinent labs & imaging results that were available during my care of the patient were reviewed by me and considered in my medical decision making (see chart for details).     31 year old female with chest pain radiating to her back, between her shoulder blades and to the right side of her neck with associated dyspnea on exertion.  Chest pain is pleuritic in nature.  Patient has risk factors for PE including oral contraceptive use.  No history of hypertension, hyperlipidemia, diabetes.  The patient's pain is nonexertional and non-positional.  No shortness of breath rest, diaphoresis, nausea or vomiting.  She has never had  a cardiac eval before.  No family history of CVD.  Patient is a smoker.  No drug use.  Patient vital signs are reassuring.  Exam reassuring without any JVD or carotid bruit. Heart RRR. No new murmurs. Lungs CTA b/l.  Patient does have tenderness to palpation over the chest where pain is located. Labs done in triage show 0.00 initial troponin.  Pregnancy test negative.  BMP and CBC reassuring.  X-ray without any cardiopulmonary disease. Will add on D-Dimer as cannot rule out PE as patient is on OCP and has pleuritic CP.    CTA negative for PE. Patient heart score 2. Patient with resolution of pain after ibuprofen. Pt has been advised start a ibuprofen and return to the ED is CP becomes exertional, associated with diaphoresis or nausea, radiates to left jaw/arm, worsens or becomes concerning in any way. Pt appears reliable for follow up and is agreeable to discharge.   Final Clinical Impressions(s) / ED Diagnoses   Final diagnoses:  Other chest pain    ED Discharge Orders        Ordered    ibuprofen (ADVIL,MOTRIN) 600 MG tablet  Every 6 hours PRN     12/21/16 1505       Princella Pellegrini 12/21/16 2045    Alvira Monday, MD 12/23/16 [id number]

## 2016-12-21 NOTE — ED Notes (Signed)
Pt returned from CT. Pt on monitors resting comfortably in bed

## 2016-12-21 NOTE — ED Triage Notes (Signed)
Pt reports right side chest pain that started yesterday evening, radiates through to her back and pt has sob. No acute distress is noted at this time, ekg done.

## 2016-12-24 NOTE — ED Notes (Signed)
12/24/2016 Pt. Called and left message on this Rn 's office phone that she received a work note during her visit with the incorrect dates.  Corrected the work note and notified the pt.

## 2016-12-26 DIAGNOSIS — H6690 Otitis media, unspecified, unspecified ear: Secondary | ICD-10-CM | POA: Diagnosis not present

## 2017-05-09 DIAGNOSIS — M79641 Pain in right hand: Secondary | ICD-10-CM | POA: Diagnosis not present

## 2017-05-24 DIAGNOSIS — M25571 Pain in right ankle and joints of right foot: Secondary | ICD-10-CM | POA: Diagnosis not present

## 2017-05-24 DIAGNOSIS — M25572 Pain in left ankle and joints of left foot: Secondary | ICD-10-CM | POA: Diagnosis not present

## 2017-05-24 DIAGNOSIS — M19041 Primary osteoarthritis, right hand: Secondary | ICD-10-CM | POA: Diagnosis not present

## 2017-05-24 DIAGNOSIS — M19042 Primary osteoarthritis, left hand: Secondary | ICD-10-CM | POA: Diagnosis not present

## 2017-05-24 DIAGNOSIS — Z23 Encounter for immunization: Secondary | ICD-10-CM | POA: Diagnosis not present

## 2017-07-24 DIAGNOSIS — Z3040 Encounter for surveillance of contraceptives, unspecified: Secondary | ICD-10-CM | POA: Diagnosis not present

## 2017-07-24 DIAGNOSIS — Z6841 Body Mass Index (BMI) 40.0 and over, adult: Secondary | ICD-10-CM | POA: Diagnosis not present

## 2017-07-24 DIAGNOSIS — Z01419 Encounter for gynecological examination (general) (routine) without abnormal findings: Secondary | ICD-10-CM | POA: Diagnosis not present

## 2017-09-05 ENCOUNTER — Other Ambulatory Visit: Payer: Self-pay | Admitting: Podiatry

## 2017-09-05 ENCOUNTER — Encounter: Payer: Self-pay | Admitting: Podiatry

## 2017-09-05 ENCOUNTER — Ambulatory Visit (INDEPENDENT_AMBULATORY_CARE_PROVIDER_SITE_OTHER): Payer: BLUE CROSS/BLUE SHIELD

## 2017-09-05 ENCOUNTER — Ambulatory Visit: Payer: BLUE CROSS/BLUE SHIELD | Admitting: Podiatry

## 2017-09-05 DIAGNOSIS — M7751 Other enthesopathy of right foot: Secondary | ICD-10-CM

## 2017-09-05 DIAGNOSIS — M7752 Other enthesopathy of left foot: Secondary | ICD-10-CM

## 2017-09-05 DIAGNOSIS — M779 Enthesopathy, unspecified: Secondary | ICD-10-CM

## 2017-09-05 DIAGNOSIS — M79672 Pain in left foot: Secondary | ICD-10-CM

## 2017-09-05 MED ORDER — TRIAMCINOLONE ACETONIDE 10 MG/ML IJ SUSP
10.0000 mg | Freq: Once | INTRAMUSCULAR | Status: AC
Start: 2017-09-05 — End: 2017-09-05
  Administered 2017-09-05: 10 mg

## 2017-09-05 MED ORDER — DICLOFENAC SODIUM 75 MG PO TBEC: 75.0000 mg | DELAYED_RELEASE_TABLET | Freq: Two times a day (BID) | ORAL | 2 refills | Status: DC

## 2017-09-06 NOTE — Progress Notes (Signed)
Subjective:   Patient ID: Julie Keller, female   DOB: 32 y.o.   MRN: [id number]   HPI Patient presents stating she is getting a lot of pain on the top of the ankles of both feet and does not remember specific injury and states they have been hurting for several months   ROS      Objective:  Physical Exam  Neurovascular status found to be intact muscle strength is adequate range of motion within normal limits with patient noted to have quite a bit of discomfort in the ankle region bilateral with inflammation fluid around the sinus tarsi bilateral     Assessment:  Acute capsulitis of the sinus tarsi bilateral     Plan:  H&P condition reviewed and careful injection of the sinus tarsi bilateral 3 mg Kenalog 5 mg Xylocaine and advised on anti-inflammatories physical therapy.  Reappoint for [place] to recheck again in 3 weeks  X-ray indicates there is no signs of arthritis or stress fracture or fracture condition bilateral

## 2017-11-10 DIAGNOSIS — J069 Acute upper respiratory infection, unspecified: Secondary | ICD-10-CM | POA: Diagnosis not present

## 2018-01-04 DIAGNOSIS — R070 Pain in throat: Secondary | ICD-10-CM | POA: Diagnosis not present

## 2018-01-05 DIAGNOSIS — H66002 Acute suppurative otitis media without spontaneous rupture of ear drum, left ear: Secondary | ICD-10-CM | POA: Diagnosis not present

## 2018-09-03 DIAGNOSIS — Z124 Encounter for screening for malignant neoplasm of cervix: Secondary | ICD-10-CM | POA: Diagnosis not present

## 2018-09-03 DIAGNOSIS — Z3040 Encounter for surveillance of contraceptives, unspecified: Secondary | ICD-10-CM | POA: Diagnosis not present

## 2018-09-03 DIAGNOSIS — Z6841 Body Mass Index (BMI) 40.0 and over, adult: Secondary | ICD-10-CM | POA: Diagnosis not present

## 2018-09-03 DIAGNOSIS — Z01419 Encounter for gynecological examination (general) (routine) without abnormal findings: Secondary | ICD-10-CM | POA: Diagnosis not present

## 2019-10-07 DIAGNOSIS — Z01419 Encounter for gynecological examination (general) (routine) without abnormal findings: Secondary | ICD-10-CM | POA: Diagnosis not present

## 2019-10-07 DIAGNOSIS — Z3040 Encounter for surveillance of contraceptives, unspecified: Secondary | ICD-10-CM | POA: Diagnosis not present

## 2019-10-14 DIAGNOSIS — M7989 Other specified soft tissue disorders: Secondary | ICD-10-CM | POA: Diagnosis not present

## 2019-10-14 DIAGNOSIS — M67432 Ganglion, left wrist: Secondary | ICD-10-CM | POA: Diagnosis not present

## 2019-10-16 ENCOUNTER — Other Ambulatory Visit: Payer: Self-pay | Admitting: Orthopedic Surgery

## 2019-10-16 DIAGNOSIS — M7989 Other specified soft tissue disorders: Secondary | ICD-10-CM

## 2019-10-29 ENCOUNTER — Other Ambulatory Visit: Payer: BLUE CROSS/BLUE SHIELD

## 2020-06-08 DIAGNOSIS — M2560 Stiffness of unspecified joint, not elsewhere classified: Secondary | ICD-10-CM | POA: Diagnosis not present

## 2020-06-08 DIAGNOSIS — M7989 Other specified soft tissue disorders: Secondary | ICD-10-CM | POA: Diagnosis not present

## 2020-06-27 DIAGNOSIS — M2560 Stiffness of unspecified joint, not elsewhere classified: Secondary | ICD-10-CM | POA: Diagnosis not present

## 2020-07-28 DIAGNOSIS — R2231 Localized swelling, mass and lump, right upper limb: Secondary | ICD-10-CM | POA: Diagnosis not present

## 2020-07-28 DIAGNOSIS — M7989 Other specified soft tissue disorders: Secondary | ICD-10-CM | POA: Diagnosis not present

## 2020-08-01 DIAGNOSIS — M7989 Other specified soft tissue disorders: Secondary | ICD-10-CM | POA: Diagnosis not present

## 2020-08-08 ENCOUNTER — Other Ambulatory Visit: Payer: Self-pay | Admitting: Orthopedic Surgery

## 2020-08-08 DIAGNOSIS — M7989 Other specified soft tissue disorders: Secondary | ICD-10-CM

## 2020-08-13 ENCOUNTER — Other Ambulatory Visit: Payer: Self-pay

## 2020-08-13 ENCOUNTER — Ambulatory Visit
Admission: RE | Admit: 2020-08-13 | Discharge: 2020-08-13 | Disposition: A | Discharge status: Home / Self Care | Payer: BC Managed Care – PPO | Source: Ambulatory Visit | Attending: Orthopedic Surgery | Admitting: Orthopedic Surgery

## 2020-08-13 DIAGNOSIS — M25421 Effusion, right elbow: Secondary | ICD-10-CM | POA: Diagnosis not present

## 2020-08-13 DIAGNOSIS — M7989 Other specified soft tissue disorders: Secondary | ICD-10-CM

## 2020-08-13 DIAGNOSIS — R600 Localized edema: Secondary | ICD-10-CM | POA: Diagnosis not present

## 2020-08-24 DIAGNOSIS — M65841 Other synovitis and tenosynovitis, right hand: Secondary | ICD-10-CM | POA: Diagnosis not present

## 2020-08-24 DIAGNOSIS — M659 Synovitis and tenosynovitis, unspecified: Secondary | ICD-10-CM | POA: Diagnosis not present

## 2020-08-24 DIAGNOSIS — M2560 Stiffness of unspecified joint, not elsewhere classified: Secondary | ICD-10-CM | POA: Diagnosis not present

## 2020-09-04 DIAGNOSIS — H1030 Unspecified acute conjunctivitis, unspecified eye: Secondary | ICD-10-CM | POA: Diagnosis not present

## 2020-09-13 DIAGNOSIS — M659 Synovitis and tenosynovitis, unspecified: Secondary | ICD-10-CM | POA: Diagnosis not present

## 2020-09-14 DIAGNOSIS — M659 Synovitis and tenosynovitis, unspecified: Secondary | ICD-10-CM | POA: Diagnosis not present

## 2020-11-17 DIAGNOSIS — Z3040 Encounter for surveillance of contraceptives, unspecified: Secondary | ICD-10-CM | POA: Diagnosis not present

## 2020-11-17 DIAGNOSIS — Z01419 Encounter for gynecological examination (general) (routine) without abnormal findings: Secondary | ICD-10-CM | POA: Diagnosis not present

## 2020-12-08 ENCOUNTER — Ambulatory Visit: Payer: BC Managed Care – PPO | Admitting: Emergency Medicine

## 2020-12-29 DIAGNOSIS — M25531 Pain in right wrist: Secondary | ICD-10-CM | POA: Diagnosis not present

## 2020-12-29 DIAGNOSIS — R5382 Chronic fatigue, unspecified: Secondary | ICD-10-CM | POA: Diagnosis not present

## 2021-01-02 DIAGNOSIS — R922 Inconclusive mammogram: Secondary | ICD-10-CM | POA: Diagnosis not present

## 2021-01-11 DIAGNOSIS — M0609 Rheumatoid arthritis without rheumatoid factor, multiple sites: Secondary | ICD-10-CM | POA: Diagnosis not present

## 2021-01-11 DIAGNOSIS — M25531 Pain in right wrist: Secondary | ICD-10-CM | POA: Diagnosis not present

## 2021-01-11 DIAGNOSIS — R5382 Chronic fatigue, unspecified: Secondary | ICD-10-CM | POA: Diagnosis not present

## 2021-01-17 DIAGNOSIS — U071 COVID-19: Secondary | ICD-10-CM | POA: Diagnosis not present

## 2021-01-17 DIAGNOSIS — Z20822 Contact with and (suspected) exposure to covid-19: Secondary | ICD-10-CM | POA: Diagnosis not present

## 2021-02-24 ENCOUNTER — Ambulatory Visit: Payer: BC Managed Care – PPO | Admitting: Nurse Practitioner

## 2021-02-24 ENCOUNTER — Encounter: Payer: Self-pay | Admitting: Nurse Practitioner

## 2021-02-24 ENCOUNTER — Other Ambulatory Visit: Payer: Self-pay

## 2021-02-24 VITALS — BP 134/78 | HR 74 | Temp 98.1°F | Ht 71.0 in | Wt 316.0 lb

## 2021-02-24 DIAGNOSIS — Z13220 Encounter for screening for lipoid disorders: Secondary | ICD-10-CM

## 2021-02-24 DIAGNOSIS — E119 Type 2 diabetes mellitus without complications: Secondary | ICD-10-CM | POA: Diagnosis not present

## 2021-02-24 DIAGNOSIS — Z6841 Body Mass Index (BMI) 40.0 and over, adult: Secondary | ICD-10-CM | POA: Diagnosis not present

## 2021-02-24 DIAGNOSIS — Z1329 Encounter for screening for other suspected endocrine disorder: Secondary | ICD-10-CM | POA: Diagnosis not present

## 2021-02-24 DIAGNOSIS — G9332 Myalgic encephalomyelitis/chronic fatigue syndrome: Secondary | ICD-10-CM | POA: Insufficient documentation

## 2021-02-24 DIAGNOSIS — Z131 Encounter for screening for diabetes mellitus: Secondary | ICD-10-CM

## 2021-02-24 DIAGNOSIS — M069 Rheumatoid arthritis, unspecified: Secondary | ICD-10-CM | POA: Insufficient documentation

## 2021-02-24 LAB — CBC WITH DIFFERENTIAL/PLATELET
Basophils Absolute: 0 10*3/uL (ref 0.0–0.1)
Basophils Relative: 0.5 % (ref 0.0–3.0)
Eosinophils Absolute: 0.3 10*3/uL (ref 0.0–0.7)
Eosinophils Relative: 3.7 % (ref 0.0–5.0)
HCT: 39.5 % (ref 36.0–46.0)
Hemoglobin: 13 g/dL (ref 12.0–15.0)
Lymphocytes Relative: 31.2 % (ref 12.0–46.0)
Lymphs Abs: 2.5 10*3/uL (ref 0.7–4.0)
MCHC: 32.9 g/dL (ref 30.0–36.0)
MCV: 77.2 fl — ABNORMAL LOW (ref 78.0–100.0)
Monocytes Absolute: 0.4 10*3/uL (ref 0.1–1.0)
Monocytes Relative: 5.3 % (ref 3.0–12.0)
Neutro Abs: 4.8 10*3/uL (ref 1.4–7.7)
Neutrophils Relative %: 59.3 % (ref 43.0–77.0)
Platelets: 419 10*3/uL — ABNORMAL HIGH (ref 150.0–400.0)
RBC: 5.11 Mil/uL (ref 3.87–5.11)
RDW: 15.8 % — ABNORMAL HIGH (ref 11.5–15.5)
WBC: 8.1 10*3/uL (ref 4.0–10.5)

## 2021-02-24 LAB — COMPREHENSIVE METABOLIC PANEL
ALT: 14 U/L (ref 0–35)
AST: 16 U/L (ref 0–37)
Albumin: 4 g/dL (ref 3.5–5.2)
Alkaline Phosphatase: 59 U/L (ref 39–117)
BUN: 10 mg/dL (ref 6–23)
CO2: 31 mEq/L (ref 19–32)
Calcium: 9.4 mg/dL (ref 8.4–10.5)
Chloride: 101 mEq/L (ref 96–112)
Creatinine, Ser: 0.6 mg/dL (ref 0.40–1.20)
GFR: 117.54 mL/min (ref >60.00)
Glucose, Bld: 101 mg/dL — ABNORMAL HIGH (ref 70–99)
Potassium: 4.2 mEq/L (ref 3.5–5.1)
Sodium: 137 mEq/L (ref 135–145)
Total Bilirubin: 0.4 mg/dL (ref 0.2–1.2)
Total Protein: 7.3 g/dL (ref 6.0–8.3)

## 2021-02-24 LAB — LIPID PANEL
Cholesterol: 133 mg/dL (ref 0–200)
HDL: 39.1 mg/dL (ref >39.00)
LDL Cholesterol: 57 mg/dL (ref 0–99)
NonHDL: 93.95
Total CHOL/HDL Ratio: 3
Triglycerides: 183 mg/dL — ABNORMAL HIGH (ref 0.0–149.0)
VLDL: 36.6 mg/dL (ref 0.0–40.0)

## 2021-02-24 LAB — TSH: TSH: 3.74 u[IU]/mL (ref 0.35–5.50)

## 2021-02-24 LAB — HEMOGLOBIN A1C: Hgb A1c MFr Bld: 6.9 % — ABNORMAL HIGH (ref 4.6–6.5)

## 2021-02-24 MED ORDER — WEGOVY 0.25 MG/0.5ML ~~LOC~~ SOAJ: 0.2500 mg | SUBCUTANEOUS | 0 refills | Status: DC

## 2021-02-24 NOTE — Progress Notes (Signed)
Subjective:  Patient ID: Julie Keller, female    DOB: [birth year]  Age: 36 y.o. MRN: [id number]  CC:  Chief Complaint  Patient presents with   New Patient (Initial Visit)      HPI  This patient arrives today for the above.  She was mostly getting her health care through providers at her work.  She is here now because she wants to establish with a primary care provider that she can see on a regular basis.  Her main concern today is obesity.  She has tried pharmacological as well as lifestyle changes without long-term weight loss.  She reports that she was prescribed phentermine about 3 to 4 years ago and took this for 3 months.  She did not lose weight on this medication and tolerated it well, however eventually she stopped the medication and gained quite a bit of the weight back.  She is also made diet changes and tries to follow a lower carb diet.  She does take breaks at work and walks for exercise.  She denies any personal or family history of pancreatitis or thyroid cancer.  Past Medical History:  Diagnosis Date   Medical history non-contributory    Migraine    Obesity    Supervision of normal pregnancy 11/13/2012   Transfer for care from Dr. Rana Snare at 25 weeks [facility]  Dating Outside Ultrasound  Genetic Screen Too late  Anatomic [place]  29 wks normal > rescan at 33 wks : nl anatomy  GTT Third trimester: 136 > Normal 3 hr  TDaP vaccine  rec'd  Flu vaccine  recd  GBS   Baby Food Breast  Contraception  mirena  Pediatrician Unsure          Family History  Problem Relation Age of Onset   Hypertension Maternal Uncle    Cancer Maternal Grandfather        melanoma and throat cancer   Diabetes Maternal Grandfather     Social History   Social History Narrative   Not on file   Social History   Tobacco Use   Smoking status: Former    Packs/day: 0.50    Types: Cigarettes   Smokeless tobacco: Never  Substance Use Topics   Alcohol use: No     Current Meds   Medication Sig   metFORMIN (GLUCOPHAGE-XR) 500 MG 24 hr tablet metformin ER 500 mg tablet,extended release 24 hr   norgestimate-ethinyl estradiol (ORTHO-CYCLEN,SPRINTEC,PREVIFEM) 0.25-35 MG-MCG tablet Take 1 tablet by mouth daily.   Semaglutide-Weight Management (WEGOVY) 0.25 MG/0.5ML SOAJ Inject 0.25 mg into the skin once a week.    ROS:  Review of Systems  Constitutional:  Negative for malaise/fatigue.  Respiratory:  Negative for shortness of breath.   Cardiovascular:  Negative for chest pain and palpitations.  Gastrointestinal:  Negative for abdominal pain and blood in stool.  Neurological:  Negative for seizures, loss of consciousness and headaches.  Psychiatric/Behavioral:  Negative for suicidal ideas.     Objective:   Today's Vitals: BP 134/78 (BP Location: Left Arm, Patient Position: Sitting, Cuff Size: Large)    Pulse 74    Temp 98.1 F (36.7 C) (Oral)    Ht  (1.803 m)    Wt (!) 316 lb (143.3 kg)    SpO2 98%    BMI 44.07 kg/m  Vitals with BMI 02/24/2021 12/21/2016 12/21/2016  Height  - -  Weight 316 lbs - -  BMI 44.09 - -  Systolic 134 133  126  Diastolic 78 76 81  Pulse 74 65 84     Physical Exam Vitals reviewed.  Constitutional:      General: She is not in acute distress.    Appearance: Normal appearance. She is obese.  HENT:     Head: Normocephalic and atraumatic.  Neck:     Vascular: No carotid bruit.  Cardiovascular:     Rate and Rhythm: Normal rate and regular rhythm.     Pulses: Normal pulses.     Heart sounds: Normal heart sounds.  Pulmonary:     Effort: Pulmonary effort is normal.     Breath sounds: Normal breath sounds.  Skin:    General: Skin is warm and dry.  Neurological:     General: No focal deficit present.     Mental Status: She is alert and oriented to person, place, and time.  Psychiatric:        Mood and Affect: Mood normal.        Behavior: Behavior normal.        Judgment: Judgment normal.         Assessment and  Plan   1. Class 3 severe obesity without serious comorbidity with body mass index (BMI) of 40.0 to 44.9 in adult, unspecified obesity type (HCC)   2. Screening for diabetes mellitus   3. Screening for thyroid disorder   4. Screening, lipid      Plan: 1.  We discussed treatment options, and I recommend she try [facility].  She denies personal or family history of pancreatitis or thyroid cancer.  We did discuss risks of pancreatitis and that there was increased risk of thyroid cancer in rats when this medication was being studied.  We also discussed common side effects of the medication and how to administer it as well as how to safely store used needles by using a sharps container.  We discussed rotating injection sites and that she will inject once a week.  She will return to office in about 4 weeks for comprehensive physical exam as well as to discuss how she is tolerating the [facility] and to determine if we can titrate up on the dose. 2.-4.  We will check blood work today for further evaluation.   Tests ordered Orders Placed This Encounter  Procedures   TSH   Hemoglobin A1c   Lipid panel   Comprehensive metabolic panel   CBC with Differential/Platelet      Meds ordered this encounter  Medications   Semaglutide-Weight Management (WEGOVY) 0.25 MG/0.5ML SOAJ    Sig: Inject 0.25 mg into the skin once a week.    Dispense:  2 mL    Refill:  0    Order Specific Question:   Supervising Provider    Answer:   Pincus Sanes [id number]    Patient to follow-up in 1 month, or sooner as needed.  Elenore Paddy, NP

## 2021-02-24 NOTE — Patient Instructions (Signed)
Semaglutide Injection (Weight Management) ?What is this medication? ?SEMAGLUTIDE (SEM a GLOO tide) promotes weight loss. It may also be used to maintain weight loss. It works by decreasing appetite. Changes to diet and exercise are often combined with this medication. ?This medicine may be used for other purposes; ask your health care provider or pharmacist if you have questions. ?COMMON BRAND NAME(S): Wegovy ?What should I tell my care team before I take this medication? ?They need to know if you have any of these conditions: ?Endocrine tumors (MEN 2) or if someone in your family had these tumors ?Eye disease, vision problems ?Gallbladder disease ?History of depression or mental health disease ?History of pancreatitis ?Kidney disease ?Stomach or intestine problems ?Suicidal thoughts, plans, or attempt; a previous suicide attempt by you or a family member ?Thyroid cancer or if someone in your family had thyroid cancer ?An unusual or allergic reaction to semaglutide, other medications, foods, dyes, or preservatives ?Pregnant or trying to get pregnant ?Breast-feeding ?How should I use this medication? ?This medication is injected under the skin. You will be taught how to prepare and give it. Take it as directed on the prescription label. It is given once every week (every 7 days). Keep taking it unless your care team tells you to stop. ?It is important that you put your used needles and pens in a special sharps container. Do not put them in a trash can. If you do not have a sharps container, call your pharmacist or care team to get one. ?A special MedGuide will be given to you by the pharmacist with each prescription and refill. Be sure to read this information carefully each time. ?This medication comes with INSTRUCTIONS FOR USE. Ask your pharmacist for directions on how to use this medication. Read the information carefully. Talk to your pharmacist or care team if you have questions. ?Talk to your care team about  the use of this medication in children. Special care may be needed. ?Overdosage: If you think you have taken too much of this medicine contact a poison control center or emergency room at once. ?NOTE: This medicine is only for you. Do not share this medicine with others. ?What if I miss a dose? ?If you miss a dose and the next scheduled dose is more than 2 days away, take the missed dose as soon as possible. If you miss a dose and the next scheduled dose is less than 2 days away, do not take the missed dose. Take the next dose at your regular time. Do not take double or extra doses. If you miss your dose for 2 weeks or more, take the next dose at your regular time or call your care team to talk about how to restart this medication. ?What may interact with this medication? ?Insulin and other medications for diabetes ?This list may not describe all possible interactions. Give your health care provider a list of all the medicines, herbs, non-prescription drugs, or dietary supplements you use. Also tell them if you smoke, drink alcohol, or use illegal drugs. Some items may interact with your medicine. ?What should I watch for while using this medication? ?Visit your care team for regular checks on your progress. It may be some time before you see the benefit from this medication. ?Drink plenty of fluids while taking this medication. Check with your care team if you have severe diarrhea, nausea, and vomiting, or if you sweat a lot. The loss of too much body fluid may make it dangerous for   you to take this medication. ?This medication may affect blood sugar levels. Ask your care team if changes in diet or medications are needed if you have diabetes. ?If you or your family notice any changes in your behavior, such as new or worsening depression, thoughts of harming yourself, anxiety, other unusual or disturbing thoughts, or memory loss, call your care team right away. ?Women should inform their care team if they wish to  become pregnant or think they might be pregnant. Losing weight while pregnant is not advised and may cause harm to the unborn child. Talk to your care team for more information. ?What side effects may I notice from receiving this medication? ?Side effects that you should report to your care team as soon as possible: ?Allergic reactions--skin rash, itching, hives, swelling of the face, lips, tongue, or throat ?Change in vision ?Dehydration--increased thirst, dry mouth, feeling faint or lightheaded, headache, dark yellow or brown urine ?Gallbladder problems--severe stomach pain, nausea, vomiting, fever ?Heart palpitations--rapid, pounding, or irregular heartbeat ?Kidney injury--decrease in the amount of urine, swelling of the ankles, hands, or feet ?Pancreatitis--severe stomach pain that spreads to your back or gets worse after eating or when touched, fever, nausea, vomiting ?Thoughts of suicide or self-harm, worsening mood, feelings of depression ?Thyroid cancer--new mass or lump in the neck, pain or trouble swallowing, trouble breathing, hoarseness ?Side effects that usually do not require medical attention (report to your care team if they continue or are bothersome): ?Diarrhea ?Loss of appetite ?Nausea ?Stomach pain ?Vomiting ?This list may not describe all possible side effects. Call your doctor for medical advice about side effects. You may report side effects to FDA at 1-800-FDA-1088. ?Where should I keep my medication? ?Keep out of the reach of children and pets. ?Refrigeration (preferred): Store in the refrigerator. Do not freeze. Keep this medication in the original container until you are ready to take it. Get rid of any unused medication after the expiration date. ?Room temperature: If needed, prior to cap removal, the pen can be stored at room temperature for up to 28 days. Protect from light. If it is stored at room temperature, get rid of any unused medication after 28 days or after it expires,  whichever is first. ?It is important to get rid of the medication as soon as you no longer need it or it is expired. You can do this in two ways: ?Take the medication to a medication take-back program. Check with your pharmacy or law enforcement to find a location. ?If you cannot return the medication, follow the directions in the MedGuide. ?NOTE: This sheet is a summary. It may not cover all possible information. If you have questions about this medicine, talk to your doctor, pharmacist, or health care provider. ?? 2022 Elsevier/Gold Standard (2020-04-15 00:00:00) ? ?

## 2021-03-10 DIAGNOSIS — N6314 Unspecified lump in the right breast, lower inner quadrant: Secondary | ICD-10-CM | POA: Diagnosis not present

## 2021-03-10 DIAGNOSIS — R922 Inconclusive mammogram: Secondary | ICD-10-CM | POA: Diagnosis not present

## 2021-03-10 DIAGNOSIS — N6489 Other specified disorders of breast: Secondary | ICD-10-CM | POA: Diagnosis not present

## 2021-03-23 ENCOUNTER — Ambulatory Visit: Payer: BC Managed Care – PPO | Admitting: Nurse Practitioner

## 2021-03-23 ENCOUNTER — Encounter: Payer: Self-pay | Admitting: Nurse Practitioner

## 2021-03-23 ENCOUNTER — Other Ambulatory Visit: Payer: Self-pay

## 2021-03-23 VITALS — BP 134/78 | HR 86 | Temp 97.8°F | Ht 71.0 in | Wt 313.4 lb

## 2021-03-23 DIAGNOSIS — D75839 Thrombocytosis, unspecified: Secondary | ICD-10-CM

## 2021-03-23 DIAGNOSIS — E1169 Type 2 diabetes mellitus with other specified complication: Secondary | ICD-10-CM | POA: Insufficient documentation

## 2021-03-23 DIAGNOSIS — E119 Type 2 diabetes mellitus without complications: Secondary | ICD-10-CM | POA: Diagnosis not present

## 2021-03-23 DIAGNOSIS — E782 Mixed hyperlipidemia: Secondary | ICD-10-CM | POA: Diagnosis not present

## 2021-03-23 MED ORDER — OZEMPIC (0.25 OR 0.5 MG/DOSE) 2 MG/1.5ML ~~LOC~~ SOPN: 0.2500 mg | PEN_INJECTOR | SUBCUTANEOUS | 2 refills | Status: DC

## 2021-03-23 NOTE — Progress Notes (Signed)
? ? ? ?Subjective:  ?Patient ID: Julie Keller, female    DOB: [birth year]  Age: 36 y.o. MRN: [id number] ? ?CC:  ?Chief Complaint  ?Patient presents with  ? Follow-up  ?  Thrombocytosis, hyperlipidemia  ? Diabetes  ? Obesity  ?  ? ? ?HPI  ?This patient arrives today for the above.  She had her initial visit with me about 1 month ago.  That time blood work was collected.  She is here to discuss results. ? ?Thrombocytosis: Blood work showed mild thrombocytosis with platelet count of 419 with mildly elevated RDW.  No anemia noted (hemoglobin 13.0, hematocrit 39.5).  She does have a history of rheumatoid arthritis and is currently working with her specialist to get Humira started. ? ?Hyperlipidemia: Triglycerides were slightly elevated at 183 on last blood work.  LDL at goal of 57 with total cholesterol 133 and HDL 39.1. ? ?Diabetes: A1c 6.9.  She continues on metformin is tolerating this well.  We did discuss next steps regarding treatment options and she would like to consider trying Ozempic.  At last office visit we discussed [facility] for weight loss, at that point she denied any personal/family history of thyroid cancer or pancreatitis. ? ?Obesity: She is actively trying to lose weight.  We did prescribe Wegovy at last office visit, however she has been unable to get this filled due to insurance/financial barriers as well as the pharmacy reporting that they are out of stock of the medication. ? ?Past Medical History:  ?Diagnosis Date  ? Medical history non-contributory   ? Migraine   ? Obesity   ? Supervision of normal pregnancy 11/13/2012  ? Transfer for care from Dr. Rana Snare at 25 weeks [facility]  Dating Outside Ultrasound  Genetic Screen Too late  Anatomic [place]  29 wks normal > rescan at 33 wks : nl anatomy  GTT Third trimester: 136 > Normal 3 hr  TDaP vaccine  rec'd  Flu vaccine  recd  GBS   Baby Food Breast  Contraception  mirena  Pediatrician Unsure      ? ? ? ? ?Family History  ?Problem Relation Age of Onset  ?  Hypertension Maternal Uncle   ? Cancer Maternal Grandfather   ?     melanoma and throat cancer  ? Diabetes Maternal Grandfather   ? ? ?Social History  ? ?Social History Narrative  ? Not on file  ? ?Social History  ? ?Tobacco Use  ? Smoking status: Former  ?  Packs/day: 0.50  ?  Types: Cigarettes  ? Smokeless tobacco: Never  ?Substance Use Topics  ? Alcohol use: No  ? ? ? ?Current Meds  ?Medication Sig  ? metFORMIN (GLUCOPHAGE-XR) 500 MG 24 hr tablet metformin ER 500 mg tablet,extended release 24 hr  ? norgestimate-ethinyl estradiol (ORTHO-CYCLEN,SPRINTEC,PREVIFEM) 0.25-35 MG-MCG tablet Take 1 tablet by mouth daily.  ? Semaglutide,0.25 or 0.5MG /DOS, (OZEMPIC, 0.25 OR 0.5 MG/DOSE,) 2 MG/1.5ML SOPN Inject 0.25 mg into the skin once a week.  ? [DISCONTINUED] Semaglutide-Weight Management (WEGOVY) 0.25 MG/0.5ML SOAJ Inject 0.25 mg into the skin once a week.  ? ? ?ROS:  ?Review of Systems  ?Eyes:  Negative for blurred vision.  ?Respiratory:  Negative for shortness of breath.   ?Cardiovascular:  Negative for chest pain.  ? ? ?Objective:  ? ?Today's Vitals: BP 134/78   Pulse 86   Temp 97.8 ?F (36.6 ?C) (Oral)   Ht  (1.803 m)   Wt (!) 313 lb 6 oz (142.1 kg)  SpO2 96%   BMI 43.71 kg/m?  ?Vitals with BMI 03/23/2021 02/24/2021 12/21/2016  ?Height 5\' 11"  5\' 11"  -  ?Weight 313 lbs 6 oz 316 lbs -  ?BMI 43.73 44.09 -  ?Systolic 134 134 335  ?Diastolic 78 78 76  ?Pulse 86 74 65  ?  ? ?Physical Exam ?Vitals reviewed.  ?Constitutional:   ?   General: She is not in acute distress. ?   Appearance: Normal appearance.  ?HENT:  ?   Head: Normocephalic and atraumatic.  ?Neck:  ?   Vascular: No carotid bruit.  ?Cardiovascular:  ?   Rate and Rhythm: Normal rate and regular rhythm.  ?   Pulses: Normal pulses.  ?   Heart sounds: Normal heart sounds.  ?Pulmonary:  ?   Effort: Pulmonary effort is normal.  ?   Breath sounds: Normal breath sounds.  ?Skin: ?   General: Skin is warm and dry.  ?Neurological:  ?   General: No focal deficit  present.  ?   Mental Status: She is alert and oriented to person, place, and time.  ?Psychiatric:     ?   Mood and Affect: Mood normal.     ?   Behavior: Behavior normal.     ?   Judgment: Judgment normal.  ? ? ? ? ? ? ? ?Assessment and Plan  ? ?1. Type 2 diabetes mellitus without complication, without long-term current use of insulin (HCC)   ?2. Thrombocytosis   ?3. Elevated cholesterol with high triglycerides   ?4. Morbid obesity (HCC)   ? ? ? ?Plan: ?1.,  3.  Per shared decision making we will try Ozempic (0.25mg /week) for treatment of her type 2 diabetes in addition to her metformin.  We did discuss that dosing is 1 weekly injections subcutaneous.  We did discuss cleaning sites with alcohol prior to injection and rotating sites.  She will tell the pharmacy she is no longer going to pick up the [facility] and instead will start Ozempic.  We did discuss if she notices any lumps or bumps to her neck while taking medication she should stop it immediately notify me.  We also discussed common side effects and that she should call the office if she receives the pens and has any questions regarding administration.  She tells me she understands.  She will follow-up in 1 month to see how she is tolerating the [facility] and to see if we can increase the dose.  LDL is at goal, for now she will try to focus on lifestyle changes to see if this will also help with her triglycerides. ?2.  Likely related to generalized inflammation of chronic disease secondary to her diabetes and rheumatoid arthritis.  We will consider rechecking platelet level next time I see her in person. ?4.  We will discontinue Wegovy, and patient will focus on lifestyle changes in addition with her medications for diabetes. ? ? ?Tests ordered ?No orders of the defined types were placed in this encounter. ? ? ? ? ?Meds ordered this encounter  ?Medications  ? Semaglutide,0.25 or 0.5MG /DOS, (OZEMPIC, 0.25 OR 0.5 MG/DOSE,) 2 MG/1.5ML SOPN  ?  Sig: Inject 0.25 mg into  the skin once a week.  ?  Dispense:  3 mL  ?  Refill:  2  ?  Order Specific Question:   Supervising Provider  ?  Answer:   Pincus Sanes [[id number]]  ? ? ?Patient to follow-up in 4 weeks, or sooner as needed. ? ?Elenore Paddy,  NP ? ?

## 2021-03-29 ENCOUNTER — Encounter: Payer: Self-pay | Admitting: Nurse Practitioner

## 2021-03-29 NOTE — Telephone Encounter (Signed)
PA for Ozempic was submitted and APPROVED. ?Key: [id number] ? ?Effective from 03/29/2021 through 03/28/2022. ?

## 2021-04-21 ENCOUNTER — Encounter: Payer: BC Managed Care – PPO | Admitting: Nurse Practitioner

## 2021-04-21 NOTE — Progress Notes (Signed)
Called patient for follow-up of diabetes since she was prescribed Ozempic.  She tells me she has not been able to get this filled at the pharmacy as of yet, she does plan on calling pharmacy this weekend to see if it is ready for pickup.  She tells me she will call the office to schedule an appointment about 4 weeks after she starts the Ozempic.  Appointment canceled today. ? ? ?

## 2021-05-19 ENCOUNTER — Telehealth (INDEPENDENT_AMBULATORY_CARE_PROVIDER_SITE_OTHER): Payer: BC Managed Care – PPO | Admitting: Nurse Practitioner

## 2021-05-19 ENCOUNTER — Other Ambulatory Visit: Payer: Self-pay | Admitting: Nurse Practitioner

## 2021-05-19 ENCOUNTER — Encounter: Payer: Self-pay | Admitting: Nurse Practitioner

## 2021-05-19 DIAGNOSIS — E119 Type 2 diabetes mellitus without complications: Secondary | ICD-10-CM | POA: Diagnosis not present

## 2021-05-19 MED ORDER — OZEMPIC (0.25 OR 0.5 MG/DOSE) 2 MG/1.5ML ~~LOC~~ SOPN: 0.5000 mg | PEN_INJECTOR | SUBCUTANEOUS | 0 refills | Status: DC

## 2021-05-19 NOTE — Assessment & Plan Note (Signed)
We will increase Ozempic dose to 0.5 mg/inj./week starting next Sunday.  Patient will follow-up in approximately 4 weeks to see how she is tolerating the increased dose and to have A1c rechecked.  May also consider checking for albuminuria at next office visit. ?

## 2021-05-19 NOTE — Progress Notes (Signed)
? ? ?An audio-only tele-health visit was completed today for this patient. I connected with  Julie Keller Ofarrell on 05/19/21 utilizing audio-only technology and verified that I am speaking with the correct person using two identifiers. The patient was located at their place of employment, and I was located at the office of [facility] during the encounter. I discussed the limitations of evaluation and management by telemedicine. The patient expressed understanding and agreed to proceed.  ? ? ?Subjective:  ?Patient ID: Julie Keller Proud, female    DOB: 20-Aug-1985  Age: 36 y.o. MRN: [id number] ? ?CC:  ?Chief Complaint  ?Patient presents with  ? Diabetes  ?  ? ? ?HPI  ?This patient arrives today for the above. ? ?She started Ozempic about 4 weeks ago for treatment of diabetes.  Last A1c 6.9.  This was collected in February.  She also continues on metformin.  She is tolerating both medications well.  She does feel some malaise the day after her Ozempic injection but otherwise is tolerating it.  She denies any new masses or lumps to her throat. She has not been experiencing any signs of hypoglycemia.  She does have Keller glucometer but has not been using it regularly.  She has also noticed approximately 10 pound weight loss since starting her Ozempic. ? ?Past Medical History:  ?Diagnosis Date  ? Medical history non-contributory   ? Migraine   ? Obesity   ? Supervision of normal pregnancy 11/13/2012  ? Transfer for care from [facility]. R[facility] at [facility] weeks [facility]  Dating Outside Ultrasound  Genetic Screen Too late  Anatomic US  29 wks normal > rescan at 33 wks : nl anatomy  GTT Third trimester: 136 > Normal 3 hr  TDaP vaccine  rec'd  Flu vaccine  recd  GBS   Baby Food Breast  Contraception  mirena  Pediatrician Unsure      ? ? ? ? ?Family History  ?Problem Relation Age of Onset  ? Hypertension Maternal Uncle   ? Cancer Maternal Grandfather   ?     melanoma and throat cancer  ? Diabetes Maternal Grandfather    ? ? ?Social History  ? ?Social History Narrative  ? Not on file  ? ?Social History  ? ?Tobacco Use  ? Smoking status: Former  ?  Packs/day: 0.50  ?  Types: Cigarettes  ? Smokeless tobacco: Never  ?Substance Use Topics  ? Alcohol use: No  ? ? ? ?Current Meds  ?Medication Sig  ? metFORMIN (GLUCOPHAGE-XR) 500 MG 24 hr tablet metformin ER 500 mg tablet,extended release 24 hr  ? norgestimate-ethinyl estradiol (ORTHO-CYCLEN,SPRINTEC,PREVIFEM) 0.25-35 MG-MCG tablet Take 1 tablet by mouth daily.  ? [DISCONTINUED] Semaglutide,0.25 or 0.5MG /DOS, (OZEMPIC, 0.25 OR 0.5 MG/DOSE,) 2 MG/1.5ML SOPN Inject 0.25 mg into the skin once Keller week.  ? ? ?ROS:  ?See HPI ? ? ?Objective:  ? ?Today's Vitals: LMP 04/19/2021 (Approximate)  ? ?  03/23/2021  ?  7:53 AM 02/24/2021  ?  8:08 AM  ?Vitals with BMI  ?Height 5\' 11"  5\' 11"   ?Weight 313 lbs 6 oz 316 lbs  ?BMI 43.73 44.09  ?Systolic 134 134  ?Diastolic 78 78  ?Pulse 86 74  ?  ? ?Physical Exam ?Comprehensive physical exam not completed today as office visit was conducted remotely.  Patient sounded well over the phone.  Patient was alert and oriented, and appeared to have appropriate judgment. ? ? ? ? ? ? ?Assessment and Plan  ? ?  1. Type 2 diabetes mellitus without complication, without long-term current use of insulin (HCC)   ? ? ? ?Plan: ?See plan via problem list below. ? ? ?Tests ordered ?No orders of the defined types were placed in this encounter. ? ? ? ? ?Meds ordered this encounter  ?Medications  ? Semaglutide,0.25 or 0.5MG /DOS, (OZEMPIC, 0.25 OR 0.5 MG/DOSE,) 2 MG/1.5ML SOPN  ?  Sig: Inject 0.5 mg into the skin once Keller week.  ?  Dispense:  3 mL  ?  Refill:  0  ?  Order Specific Question:   Supervising Provider  ?  Answer:   Pincus Sanes [[id number]]  ? ? ?Patient to follow-up in 4 weeks, or sooner as needed.  Total time spent on the telephone today was approximately 9 minutes and 30 seconds. ? ?Elenore Paddy, NP ? ?

## 2021-05-31 ENCOUNTER — Other Ambulatory Visit: Payer: Self-pay | Admitting: Nurse Practitioner

## 2021-05-31 ENCOUNTER — Encounter: Payer: Self-pay | Admitting: Nurse Practitioner

## 2021-05-31 DIAGNOSIS — E119 Type 2 diabetes mellitus without complications: Secondary | ICD-10-CM

## 2021-05-31 MED ORDER — METFORMIN HCL ER 500 MG PO TB24: 500.0000 mg | ORAL_TABLET | Freq: Every day | ORAL | 1 refills | Status: DC

## 2021-08-12 ENCOUNTER — Other Ambulatory Visit: Payer: Self-pay | Admitting: Nurse Practitioner

## 2021-08-12 DIAGNOSIS — E119 Type 2 diabetes mellitus without complications: Secondary | ICD-10-CM

## 2021-08-17 ENCOUNTER — Other Ambulatory Visit: Payer: Self-pay | Admitting: Nurse Practitioner

## 2021-08-17 DIAGNOSIS — E119 Type 2 diabetes mellitus without complications: Secondary | ICD-10-CM

## 2021-08-17 DIAGNOSIS — M0609 Rheumatoid arthritis without rheumatoid factor, multiple sites: Secondary | ICD-10-CM | POA: Diagnosis not present

## 2021-08-17 DIAGNOSIS — M25531 Pain in right wrist: Secondary | ICD-10-CM | POA: Diagnosis not present

## 2021-08-17 DIAGNOSIS — R5382 Chronic fatigue, unspecified: Secondary | ICD-10-CM | POA: Diagnosis not present

## 2021-08-17 MED ORDER — OZEMPIC (0.25 OR 0.5 MG/DOSE) 2 MG/1.5ML ~~LOC~~ SOPN: 0.5000 mg | PEN_INJECTOR | SUBCUTANEOUS | 0 refills | Status: DC

## 2021-09-08 ENCOUNTER — Ambulatory Visit: Payer: BC Managed Care – PPO | Admitting: Nurse Practitioner

## 2021-09-18 DIAGNOSIS — N6489 Other specified disorders of breast: Secondary | ICD-10-CM | POA: Diagnosis not present

## 2021-09-18 DIAGNOSIS — R922 Inconclusive mammogram: Secondary | ICD-10-CM | POA: Diagnosis not present

## 2021-09-22 IMAGING — MR MR WRIST*R* W/O CM
4 of 6 series · 25 of 40 positions shown · non-contrast
Comparison: Limited ultrasound 09/30/2020 and radiographs from
10/14/2019

CLINICAL DATA: Pain and swelling with palpable lumps.

EXAM:
MR OF THE RIGHT WRIST WITHOUT CONTRAST
TECHNIQUE: Multiplanar, multisequence MR imaging of the right wrist was
performed. No intravenous contrast was administered.

[Series 8: T2 fat-sat · coronal · 3.0mm · 0.23mm/px · 6 of 19 slices shown (1 of 2)]
[im 1/19]
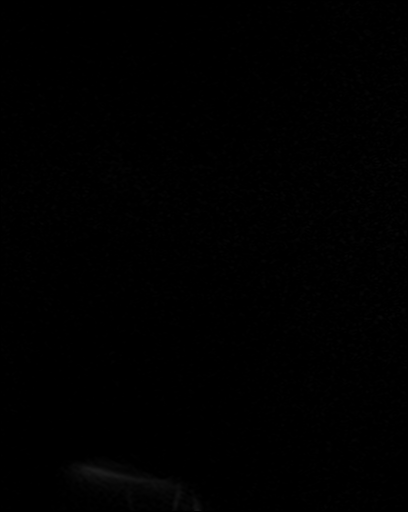
[im 4/19]
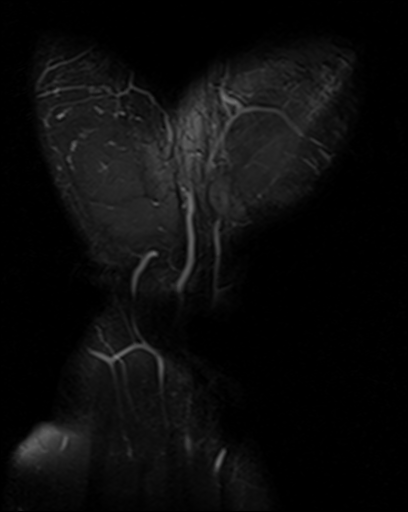
[im 8/19]
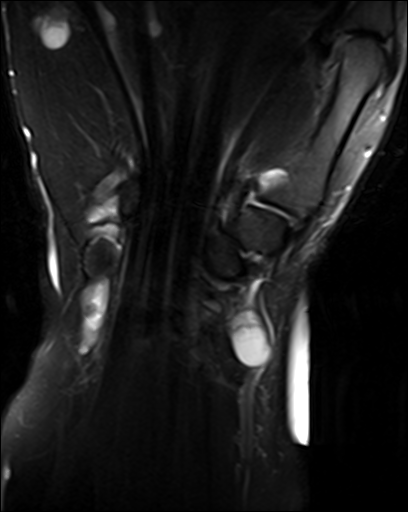
[im 11/19]
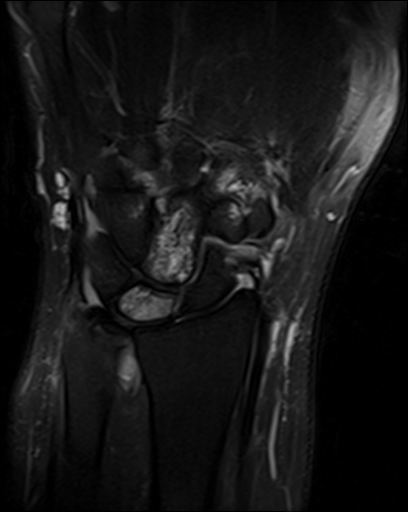
[im 15/19]
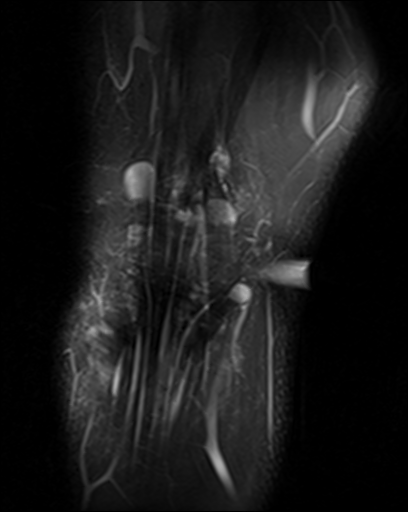
[im 19/19]
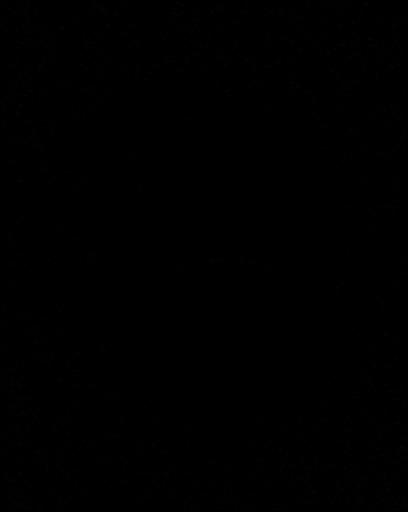

[Series 9: PD fat-sat · coronal · 3.0mm · 0.23mm/px · 5 of 19 slices shown (1 of 2)]
[im 1/19]
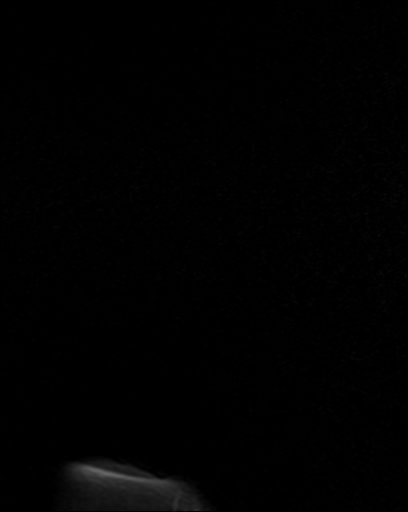
[im 5/19]
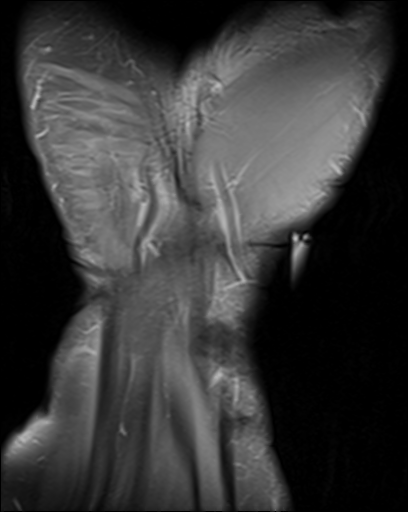
[im 10/19]
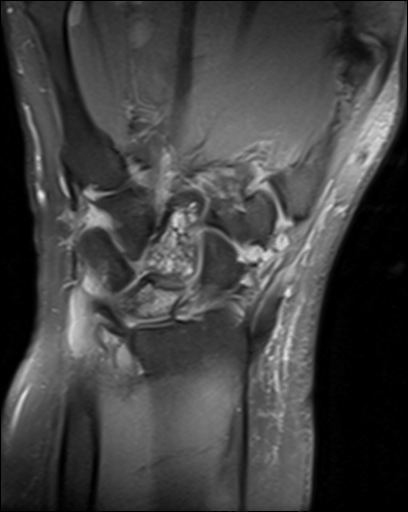
[im 14/19]
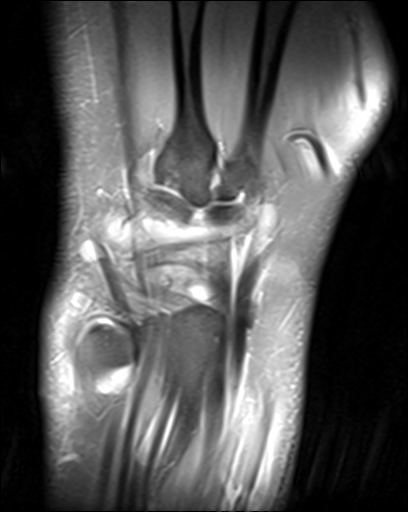
[im 19/19]
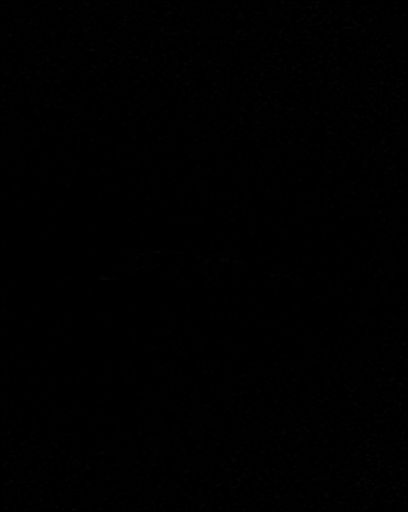

[Series 10: PD fat-sat · sagittal · 3.0mm · 0.23mm/px · 7 of 26 slices shown (2 of 2)]
[im 1/26]
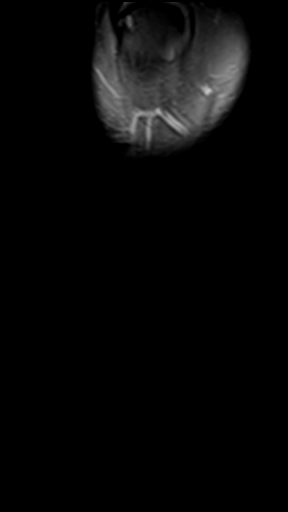
[im 5/26]
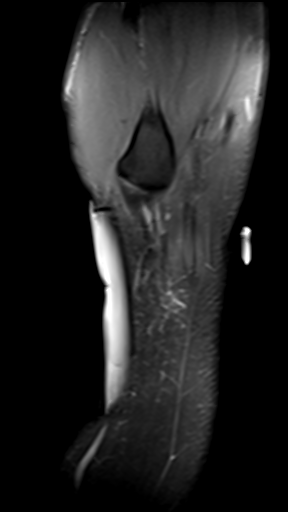
[im 9/26]
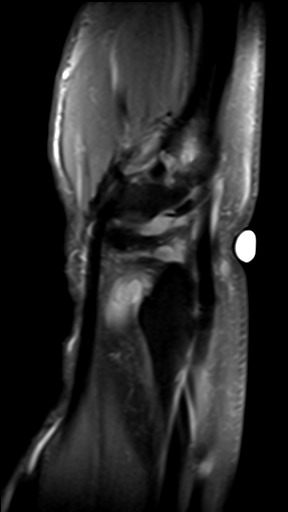
[im 13/26]
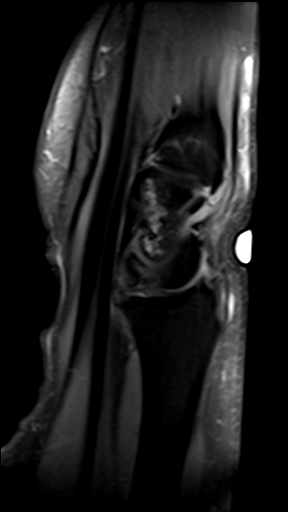
[im 17/26]
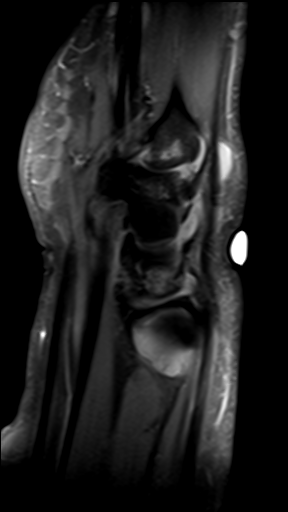
[im 21/26]
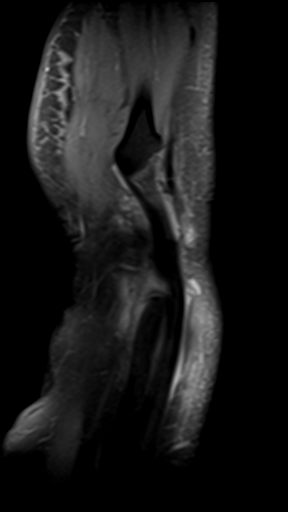
[im 26/26]
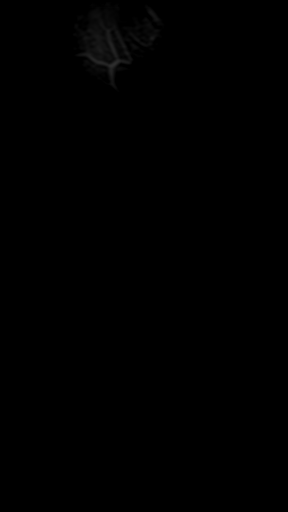

[Series 5003: T2 fat-sat · axial · 3.0mm · 0.24mm/px · z∈[-64,+13]mm · 7 of 29 slices shown (2 of 2)]
[im 1/29]
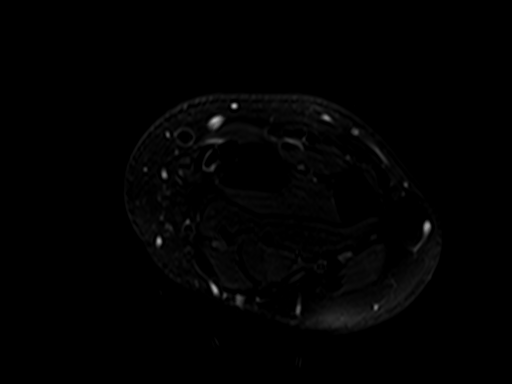
[im 5/29]
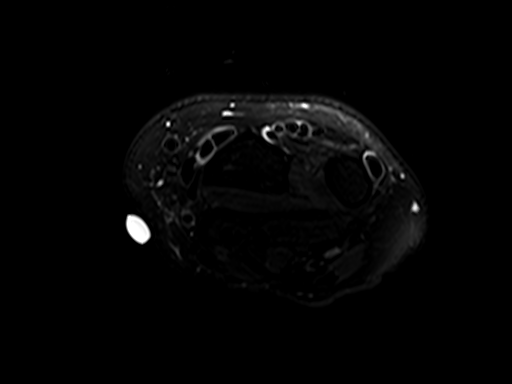
[im 9/29]
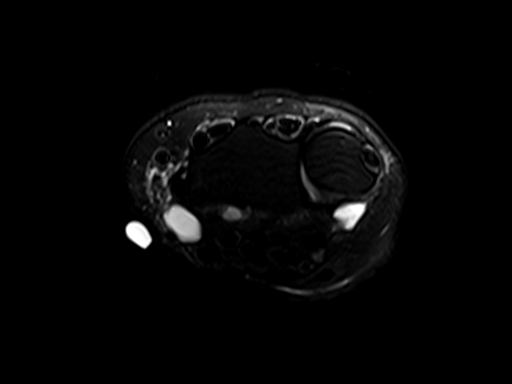
[im 13/29]
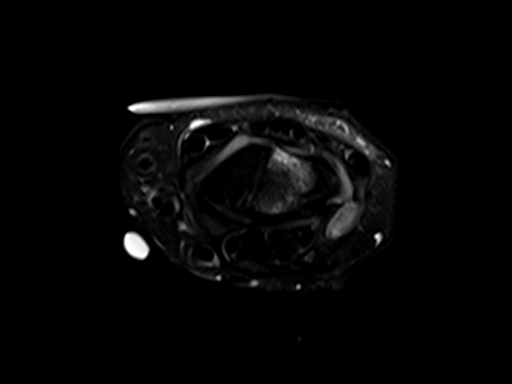
[im 17/29]
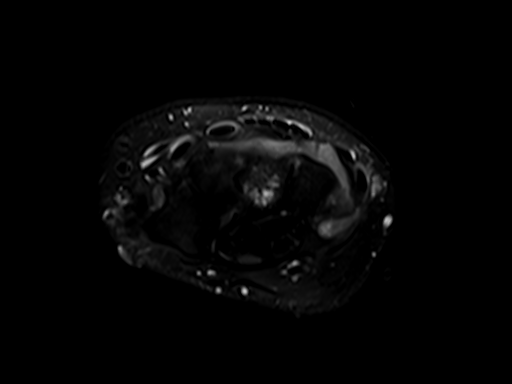
[im 21/29]
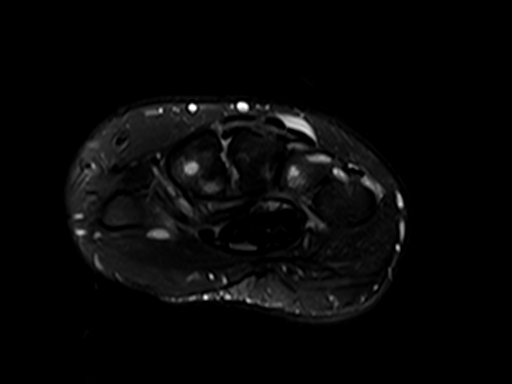
[im 25/29]
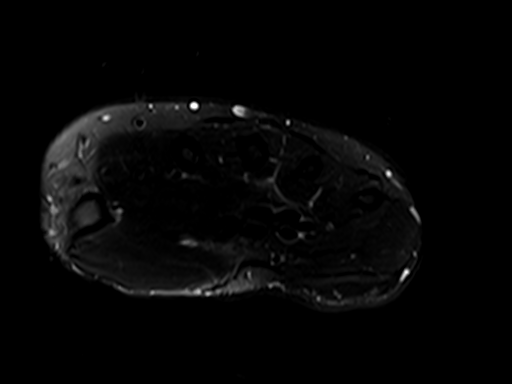

[25 of 40 positions shown; findings below may reference images not displayed]

FINDINGS: Ligaments: Unremarkable

Triangular fibrocartilage: Unremarkable

Tendons: Mild tendinopathy and tenosynovitis in the first extensor
compartment along with mild global extensor tenosynovitis. There is
some mild tenosynovitis of the distal flexor carpi radialis and
flexor pollicis longus.

Carpal tunnel/median nerve: Unremarkable

Guyon's canal: No impingement.

Joint/cartilage: Suspected mild chondral thinning along radiocarpal
surfaces. Radiocarpal joint effusion with ganglion type extensions
of high T2 signal volar to the radiocarpal and ulnocarpal joints as
shown on images 10 through 12 of series 9009, probably with
associated synovitis given the accentuated T1 signal. This synovitis
may account for the relatively solid appearance on ultrasound. The
volar radiocarpal collection on image 10 of series 1558 displaces
the radial artery.

There is an effusion of the distal radioulnar joint. Effusions and
synovitis along the carpometacarpal articulations.

Bones/carpal alignment: Substantial erosive arthropathy involving
the carpal bones and bases of the metacarpals. This is
disproportionate to the radiographic appearance on 10/14/2019.
Diffuse marrow edema in the scaphoid without current scaphoid
collapse. Geographic marrow edema throughout a substantial portion
of the capitate without visible fracture.

Mild degenerative spurring at the first carpometacarpal
articulation.

Other: No supplemental non-categorized findings.
IMPRESSION: 1. Striking erosions in the carpus and along the metacarpal bases
with diffuse edema in the lunate and geographic edema in the
capitate, joint effusions with synovitis, and diffuse extensor
tenosynovitis with additional tendinopathy in the first extensor
compartment. Appearance compatible with erosive arthropathy such as
rheumatoid arthropathy or less likely gout or psoriatic arthropathy.
Volar lesions shown on prior ultrasound appear to represent ganglion
cyst like extensions from the radiocarpal and ulnocarpal joint
containing synovitis which probably lead to the somewhat solid
appearance at ultrasound.
2. The relatively diffuse lunate edema is not currently accompanied
by lunate collapse.

## 2021-09-29 ENCOUNTER — Ambulatory Visit: Payer: BC Managed Care – PPO | Admitting: Nurse Practitioner

## 2021-10-06 ENCOUNTER — Encounter: Payer: Self-pay | Admitting: Nurse Practitioner

## 2021-10-06 ENCOUNTER — Ambulatory Visit: Payer: BC Managed Care – PPO | Admitting: Nurse Practitioner

## 2021-10-06 VITALS — BP 132/74 | HR 66 | Temp 98.1°F

## 2021-10-06 DIAGNOSIS — E119 Type 2 diabetes mellitus without complications: Secondary | ICD-10-CM | POA: Diagnosis not present

## 2021-10-06 DIAGNOSIS — D75839 Thrombocytosis, unspecified: Secondary | ICD-10-CM

## 2021-10-06 LAB — MICROALBUMIN / CREATININE URINE RATIO
Creatinine,U: 38 mg/dL
Microalb Creat Ratio: 26.3 mg/g (ref 0.0–30.0)
Microalb, Ur: 10 mg/dL — ABNORMAL HIGH (ref 0.0–1.9)

## 2021-10-06 LAB — POCT GLYCOSYLATED HEMOGLOBIN (HGB A1C): Hemoglobin A1C: 6.2 % — AB (ref 4.0–5.6)

## 2021-10-06 MED ORDER — SEMAGLUTIDE (1 MG/DOSE) 4 MG/3ML ~~LOC~~ SOPN: 1.0000 mg | PEN_INJECTOR | SUBCUTANEOUS | 2 refills | Status: DC

## 2021-10-06 NOTE — Assessment & Plan Note (Signed)
Chronic, point-of-care A1c today 6.2 which is improvement from last A1c.  Per shared decision making we will increase Ozempic to 1 mg/week.  Patient to continue on metformin 500 mg daily.  We will check urine for albuminuria.  Foot exam completed today.  Patient plans on seeing eye doctor within the next few months.  Offered flu shot but she says she will have this administered at her work.  Patient to follow-up in 4 to 8 weeks, or sooner as needed.

## 2021-10-06 NOTE — Progress Notes (Signed)
Established Patient Office Visit  Subjective   Patient ID: Julie Keller, female    DOB: [birth year]  Age: 36 y.o. MRN: [id number]  Chief Complaint  Patient presents with   Medication Management    F/U for T2DM. Diagnosed around 9 months ago at age 77.  Currently on Ozempic 0.5 mg weekly as well as metformin.  Tolerating medications well.  Denies any recent hypoglycemic events.  She has lost some weight as well since starting Ozempic.  She is due to have A1c checked today, due to have urine checked for albuminuria.  She has not yet had diabetic eye exam or foot exam.  She is not on ACE/ARB or statin therapy at this point.  Last A1c 6.9.    Review of Systems  Respiratory:  Negative for shortness of breath.   Cardiovascular:  Negative for chest pain.      Objective:     BP 132/74 (BP Location: Right Arm, Patient Position: Sitting, Cuff Size: Large)   Pulse 66   Temp 98.1 F (36.7 C) (Oral)   SpO2 98%  BP Readings from Last 3 Encounters:  10/06/21 132/74  03/23/21 134/78  02/24/21 134/78   Wt Readings from Last 3 Encounters:  03/23/21 (!) 313 lb 6 oz (142.1 kg)  02/24/21 (!) 316 lb (143.3 kg)  12/21/16 180 lb (81.6 kg)      Physical Exam Vitals reviewed.  Constitutional:      General: She is not in acute distress.    Appearance: Normal appearance.  HENT:     Head: Normocephalic and atraumatic.  Neck:     Vascular: No carotid bruit.  Cardiovascular:     Rate and Rhythm: Normal rate and regular rhythm.     Pulses: Normal pulses.          Dorsalis pedis pulses are 2+ on the right side and 2+ on the left side.     Heart sounds: Normal heart sounds.  Pulmonary:     Effort: Pulmonary effort is normal.     Breath sounds: Normal breath sounds.  Musculoskeletal:     Right foot: No deformity.     Left foot: No deformity.  Feet:     Right foot:     Protective Sensation: 10 sites tested.  10 sites sensed.     Skin integrity: Skin integrity normal.     Toenail  Condition: Right toenails are normal.     Left foot:     Protective Sensation: 10 sites tested.  10 sites sensed.     Skin integrity: Skin integrity normal.     Toenail Condition: Left toenails are normal.  Skin:    General: Skin is warm and dry.  Neurological:     General: No focal deficit present.     Mental Status: She is alert and oriented to person, place, and time.  Psychiatric:        Mood and Affect: Mood normal.        Behavior: Behavior normal.        Judgment: Judgment normal.      No results found for any visits on 10/06/21.    The ASCVD Risk score (Arnett DK, et al., 2019) failed to calculate for the following reasons:   The 2019 ASCVD risk score is only valid for ages 37 to 56    Assessment & Plan:   Problem List Items Addressed This Visit       Endocrine   Type 2 diabetes mellitus without  complication, without long-term current use of insulin (HCC)    Chronic, point-of-care A1c today 6.2 which is improvement from last A1c.  Per shared decision making we will increase Ozempic to 1 mg/week.  Patient to continue on metformin 500 mg daily.  We will check urine for albuminuria.  Foot exam completed today.  Patient plans on seeing eye doctor within the next few months.  Offered flu shot but she says she will have this administered at her work.  Patient to follow-up in 4 to 8 weeks, or sooner as needed.      Relevant Medications   Semaglutide, 1 MG/DOSE, 4 MG/3ML SOPN   Other Relevant Orders   Microalbumin / creatinine urine ratio   POCT HgB A1C   Other Visit Diagnoses     Thrombocytosis    -  Primary       Return in about 8 weeks (around 12/01/2021) for Tele visit with Dailah Opperman around 5pm.    Elenore Paddy, NP

## 2021-11-27 ENCOUNTER — Other Ambulatory Visit: Payer: Self-pay | Admitting: Nurse Practitioner

## 2021-11-27 DIAGNOSIS — E119 Type 2 diabetes mellitus without complications: Secondary | ICD-10-CM

## 2021-12-01 ENCOUNTER — Telehealth: Payer: BC Managed Care – PPO | Admitting: Nurse Practitioner

## 2021-12-01 DIAGNOSIS — E119 Type 2 diabetes mellitus without complications: Secondary | ICD-10-CM

## 2021-12-01 NOTE — Progress Notes (Signed)
Patient called to complete visit today. She was called x3 LVM x2. Have asked in voicemail that patient call back to have appointment rescheduled at her earliest convenience.

## 2021-12-15 ENCOUNTER — Encounter: Payer: Self-pay | Admitting: Nurse Practitioner

## 2021-12-20 ENCOUNTER — Other Ambulatory Visit: Payer: Self-pay

## 2021-12-20 ENCOUNTER — Other Ambulatory Visit: Payer: Self-pay | Admitting: Nurse Practitioner

## 2021-12-20 DIAGNOSIS — E119 Type 2 diabetes mellitus without complications: Secondary | ICD-10-CM

## 2021-12-20 MED ORDER — OZEMPIC (0.25 OR 0.5 MG/DOSE) 2 MG/3ML ~~LOC~~ SOPN: 0.5000 mg | PEN_INJECTOR | SUBCUTANEOUS | 2 refills | Status: DC

## 2021-12-20 MED ORDER — METFORMIN HCL ER 500 MG PO TB24: 500.0000 mg | ORAL_TABLET | Freq: Every day | ORAL | 1 refills | Status: DC

## 2022-03-19 ENCOUNTER — Telehealth: Payer: Self-pay

## 2022-03-19 NOTE — Telephone Encounter (Signed)
Pt PA for ozempic is send to plan   Key [id number]

## 2022-03-19 NOTE — Telephone Encounter (Signed)
Another key for pt PA ozempic  Key  [id number]

## 2022-03-23 NOTE — Telephone Encounter (Signed)
[id number] cancelled by [employer] due to duplicate request.  Pharmacy Patient Advocate Encounter  Received notification from [facility] that the request for prior authorization for Ozempic has been denied due to  .    Please be advised we currently do not have a Pharmacist to review denials, therefore you will need to process appeals accordingly as needed. Thanks for your support at this time.   You may download appeal form at [email or web address] (search appeal form) and fax [phone], to appeal.

## 2022-03-26 ENCOUNTER — Other Ambulatory Visit: Payer: Self-pay | Admitting: Nurse Practitioner

## 2022-03-26 DIAGNOSIS — E119 Type 2 diabetes mellitus without complications: Secondary | ICD-10-CM

## 2022-03-29 ENCOUNTER — Ambulatory Visit: Payer: BC Managed Care – PPO | Admitting: Nurse Practitioner

## 2022-03-30 ENCOUNTER — Encounter: Payer: Self-pay | Admitting: Nurse Practitioner

## 2022-03-30 NOTE — Telephone Encounter (Signed)
Noted../lmb 

## 2022-04-23 DIAGNOSIS — R928 Other abnormal and inconclusive findings on diagnostic imaging of breast: Secondary | ICD-10-CM | POA: Diagnosis not present

## 2022-04-23 DIAGNOSIS — R922 Inconclusive mammogram: Secondary | ICD-10-CM | POA: Diagnosis not present

## 2022-04-23 DIAGNOSIS — R92321 Mammographic fibroglandular density, right breast: Secondary | ICD-10-CM | POA: Diagnosis not present

## 2022-04-27 ENCOUNTER — Ambulatory Visit: Payer: BC Managed Care – PPO | Admitting: Nurse Practitioner

## 2022-06-04 ENCOUNTER — Other Ambulatory Visit: Payer: Self-pay | Admitting: Nurse Practitioner

## 2022-06-04 DIAGNOSIS — E119 Type 2 diabetes mellitus without complications: Secondary | ICD-10-CM

## 2022-06-06 DIAGNOSIS — M0609 Rheumatoid arthritis without rheumatoid factor, multiple sites: Secondary | ICD-10-CM | POA: Diagnosis not present

## 2022-06-07 DIAGNOSIS — Z124 Encounter for screening for malignant neoplasm of cervix: Secondary | ICD-10-CM | POA: Diagnosis not present

## 2022-06-07 DIAGNOSIS — Z01419 Encounter for gynecological examination (general) (routine) without abnormal findings: Secondary | ICD-10-CM | POA: Diagnosis not present

## 2022-06-15 ENCOUNTER — Ambulatory Visit: Payer: BC Managed Care – PPO | Admitting: Nurse Practitioner

## 2022-06-15 ENCOUNTER — Other Ambulatory Visit (HOSPITAL_COMMUNITY): Payer: Self-pay

## 2022-06-15 VITALS — BP 136/84 | HR 80 | Temp 98.5°F | Ht 71.0 in | Wt 309.5 lb

## 2022-06-15 DIAGNOSIS — E119 Type 2 diabetes mellitus without complications: Secondary | ICD-10-CM | POA: Diagnosis not present

## 2022-06-15 DIAGNOSIS — Z7984 Long term (current) use of oral hypoglycemic drugs: Secondary | ICD-10-CM | POA: Diagnosis not present

## 2022-06-15 DIAGNOSIS — Z6841 Body Mass Index (BMI) 40.0 and over, adult: Secondary | ICD-10-CM | POA: Diagnosis not present

## 2022-06-15 LAB — COMPREHENSIVE METABOLIC PANEL
ALT: 17 U/L (ref 0–35)
AST: 16 U/L (ref 0–37)
Albumin: 3.8 g/dL (ref 3.5–5.2)
Alkaline Phosphatase: 54 U/L (ref 39–117)
BUN: 12 mg/dL (ref 6–23)
CO2: 28 mEq/L (ref 19–32)
Calcium: 9.3 mg/dL (ref 8.4–10.5)
Chloride: 103 mEq/L (ref 96–112)
Creatinine, Ser: 0.58 mg/dL (ref 0.40–1.20)
GFR: 117.42 mL/min (ref >60.00)
Glucose, Bld: 102 mg/dL — ABNORMAL HIGH (ref 70–99)
Potassium: 4.4 mEq/L (ref 3.5–5.1)
Sodium: 139 mEq/L (ref 135–145)
Total Bilirubin: 0.4 mg/dL (ref 0.2–1.2)
Total Protein: 6.7 g/dL (ref 6.0–8.3)

## 2022-06-15 LAB — CBC
HCT: 40.2 % (ref 36.0–46.0)
Hemoglobin: 13.5 g/dL (ref 12.0–15.0)
MCHC: 33.5 g/dL (ref 30.0–36.0)
MCV: 80.8 fl (ref 78.0–100.0)
Platelets: 388 10*3/uL (ref 150.0–400.0)
RBC: 4.98 Mil/uL (ref 3.87–5.11)
RDW: 14.2 % (ref 11.5–15.5)
WBC: 7.8 10*3/uL (ref 4.0–10.5)

## 2022-06-15 LAB — LIPID PANEL
Cholesterol: 139 mg/dL (ref 0–200)
HDL: 42.6 mg/dL (ref >39.00)
LDL Cholesterol: 62 mg/dL (ref 0–99)
NonHDL: 95.94
Total CHOL/HDL Ratio: 3
Triglycerides: 172 mg/dL — ABNORMAL HIGH (ref 0.0–149.0)
VLDL: 34.4 mg/dL (ref 0.0–40.0)

## 2022-06-15 LAB — TSH: TSH: 3.06 u[IU]/mL (ref 0.35–5.50)

## 2022-06-15 LAB — HEMOGLOBIN A1C: Hgb A1c MFr Bld: 7.2 % — ABNORMAL HIGH (ref 4.6–6.5)

## 2022-06-15 MED ORDER — TIRZEPATIDE 2.5 MG/0.5ML ~~LOC~~ SOAJ
2.5000 mg | SUBCUTANEOUS | 1 refills | Status: DC
  Filled 2022-06-15: qty 2, 28d supply, fill #0
  Filled 2022-07-09 – 2022-08-11 (×3): qty 2, 28d supply, fill #1

## 2022-06-15 NOTE — Assessment & Plan Note (Signed)
Chronic Will be starting GLP-1 for treatment of type 2 diabetes, but will hopefully see weight loss with this as well. If Julie Keller is not covered by her insurance, patient was given names of other agents that she can call her insurance to see if any others are covered.  If not are covered may consider different treatment options for obesity. Patient to follow-up in 4 weeks.

## 2022-06-15 NOTE — Progress Notes (Addendum)
Established Patient Office Visit  Subjective   Patient ID: Julie Keller, female    DOB: [birth year]  Age: 37 y.o. MRN: [id number]  Chief Complaint  Patient presents with   Diabetes    T2DM/obesity: Fairly new diagnosis, discovered in February 2023.  Currently on metformin 500 mg daily.  Tolerating well.  Last A1c 6.2.  Initial A1c 6.9.  She was prescribed Ozempic, but was told by her insurance is no longer covering this so she has not been taking this routinely.  She is due for A1c to be checked again today.  She reports that she is currently menstruating, uses oral contraceptive pills for contraception.  Current BMI of 43.17, patient is trying to lose weight.  Thrombocytosis: Incidental finding on last CBC.  Patient asymptomatic    Review of Systems  Respiratory:  Negative for shortness of breath.   Cardiovascular:  Negative for chest pain.  Gastrointestinal:  Negative for abdominal pain.      Objective:     BP 136/84   Pulse 80   Temp 98.5 F (36.9 C) (Temporal)   Ht 5\' 11"  (1.803 m)   Wt (!) 309 lb 8 oz (140.4 kg)   LMP 06/10/2022   SpO2 98%   BMI 43.17 kg/m    Physical Exam Vitals reviewed.  Constitutional:      General: She is not in acute distress.    Appearance: Normal appearance.  HENT:     Head: Normocephalic and atraumatic.  Neck:     Vascular: No carotid bruit.  Cardiovascular:     Rate and Rhythm: Normal rate and regular rhythm.     Pulses: Normal pulses.     Heart sounds: Normal heart sounds.  Pulmonary:     Effort: Pulmonary effort is normal.     Breath sounds: Normal breath sounds.  Skin:    General: Skin is warm and dry.  Neurological:     General: No focal deficit present.     Mental Status: She is alert and oriented to person, place, and time.  Psychiatric:        Mood and Affect: Mood normal.        Behavior: Behavior normal.        Judgment: Judgment normal.      No results found for any visits on 06/15/22.    The ASCVD  Risk score (Arnett DK, et al., 2019) failed to calculate for the following reasons:   The 2019 ASCVD risk score is only valid for ages 79 to 17    Assessment & Plan:   Problem List Items Addressed This Visit       Endocrine   Type 2 diabetes mellitus without complication, without long-term current use of insulin (HCC) - Primary    Chronic, last A1c at goal Continue metformin 500 mg daily Patient has class III severe obesity as well with BMI of 43.  I believe she would be a good candidate for Mounjaro.  She is agreeable to trying this medication as well. Denies any personal or family history of thyroid cancer. We discussed possible risks and side effects of medication.  Specifically she was told to reach out to me if she experiences vomiting, severe abdominal pain, or notices any masses or swelling to her neck. Additionally she was told that she needs to use backup contraception for 4 weeks after every dosage change of the [facility], she reports understanding. Patient has not yet had diabetic eye exam but reports she is planning  on getting this completed.  Prescription sent to patient's pharmacy, patient to follow-up in 4 weeks.       Relevant Medications   tirzepatide (MOUNJARO) 2.5 MG/0.5ML Pen   Other Relevant Orders   Hemoglobin A1c   CBC   Comprehensive metabolic panel   Lipid panel   TSH     Other   Class 3 severe obesity with serious comorbidity and body mass index (BMI) of 40.0 to 44.9 in adult [facility])    Chronic Will be starting GLP-1 for treatment of type 2 diabetes, but will hopefully see weight loss with this as well. If Greggory Keen is not covered by her insurance, patient was given names of other agents that she can call her insurance to see if any others are covered.  If not are covered may consider different treatment options for obesity. Patient to follow-up in 4 weeks.      Relevant Medications   tirzepatide [facility]) 2.5 MG/0.5ML Pen    Return in about 1 month  (around 07/16/2022) for CPE with Maralyn Sago.    Elenore Paddy, NP

## 2022-06-15 NOTE — Patient Instructions (Addendum)
Trulicity Mounjaro Ozempic Contrave  For Diabetes

## 2022-06-15 NOTE — Assessment & Plan Note (Addendum)
Chronic, last A1c at goal Continue metformin 500 mg daily Patient has class III severe obesity as well with BMI of 43.  I believe she would be a good candidate for Mounjaro.  She is agreeable to trying this medication as well. Denies any personal or family history of thyroid cancer. We discussed possible risks and side effects of medication.  Specifically she was told to reach out to me if she experiences vomiting, severe abdominal pain, or notices any masses or swelling to her neck. Additionally she was told that she needs to use backup contraception for 4 weeks after every dosage change of the [facility], she reports understanding. Patient has not yet had diabetic eye exam but reports she is planning on getting this completed.  Prescription sent to patient's pharmacy, patient to follow-up in 4 weeks.

## 2022-06-21 DIAGNOSIS — M25531 Pain in right wrist: Secondary | ICD-10-CM | POA: Diagnosis not present

## 2022-06-21 DIAGNOSIS — R5382 Chronic fatigue, unspecified: Secondary | ICD-10-CM | POA: Diagnosis not present

## 2022-06-21 DIAGNOSIS — M0609 Rheumatoid arthritis without rheumatoid factor, multiple sites: Secondary | ICD-10-CM | POA: Diagnosis not present

## 2022-06-30 ENCOUNTER — Other Ambulatory Visit: Payer: Self-pay | Admitting: Nurse Practitioner

## 2022-06-30 DIAGNOSIS — E119 Type 2 diabetes mellitus without complications: Secondary | ICD-10-CM

## 2022-07-12 ENCOUNTER — Other Ambulatory Visit (HOSPITAL_COMMUNITY): Payer: Self-pay

## 2022-07-20 ENCOUNTER — Ambulatory Visit: Payer: BC Managed Care – PPO | Admitting: Nurse Practitioner

## 2022-07-31 ENCOUNTER — Other Ambulatory Visit (HOSPITAL_COMMUNITY): Payer: Self-pay

## 2022-08-02 ENCOUNTER — Ambulatory Visit: Payer: BC Managed Care – PPO | Admitting: Nurse Practitioner

## 2022-08-02 ENCOUNTER — Other Ambulatory Visit (HOSPITAL_COMMUNITY): Payer: Self-pay

## 2022-08-02 ENCOUNTER — Other Ambulatory Visit: Payer: Self-pay | Admitting: Nurse Practitioner

## 2022-08-02 DIAGNOSIS — E119 Type 2 diabetes mellitus without complications: Secondary | ICD-10-CM

## 2022-08-06 ENCOUNTER — Other Ambulatory Visit (HOSPITAL_COMMUNITY): Payer: Self-pay

## 2022-08-06 ENCOUNTER — Other Ambulatory Visit: Payer: Self-pay | Admitting: Nurse Practitioner

## 2022-08-06 DIAGNOSIS — E119 Type 2 diabetes mellitus without complications: Secondary | ICD-10-CM

## 2022-08-13 ENCOUNTER — Other Ambulatory Visit (HOSPITAL_COMMUNITY): Payer: Self-pay

## 2022-08-16 ENCOUNTER — Other Ambulatory Visit (HOSPITAL_COMMUNITY): Payer: Self-pay

## 2022-08-17 ENCOUNTER — Other Ambulatory Visit (HOSPITAL_COMMUNITY): Payer: Self-pay

## 2022-08-21 ENCOUNTER — Other Ambulatory Visit: Payer: Self-pay

## 2022-08-22 ENCOUNTER — Other Ambulatory Visit (HOSPITAL_COMMUNITY): Payer: Self-pay

## 2022-08-22 ENCOUNTER — Other Ambulatory Visit: Payer: Self-pay | Admitting: Nurse Practitioner

## 2022-08-22 ENCOUNTER — Encounter: Payer: Self-pay | Admitting: Nurse Practitioner

## 2022-08-22 DIAGNOSIS — E119 Type 2 diabetes mellitus without complications: Secondary | ICD-10-CM

## 2022-08-22 MED ORDER — TIRZEPATIDE 5 MG/0.5ML ~~LOC~~ SOAJ
5.0000 mg | SUBCUTANEOUS | 0 refills | Status: DC
Start: 2022-08-22 — End: 2022-11-02
  Filled 2022-08-22: qty 6, 84d supply, fill #0

## 2022-08-22 NOTE — Progress Notes (Signed)
Left voice mail for pt to call in regards to medication changes

## 2022-08-22 NOTE — Progress Notes (Signed)
Please call patient and notify her that her pharmacy reached out to let me know that her insurance has limitation on how much Mounjaro 2.5mg /injection is allowed. Due to this I have sent in the next dose of Mounjaro which is 5mg /week. Once she has completed all doses of the Mounjaro 2.5mg /week she can increase to the 5mg /week dose.   Please also remind her that with any dose increase/change in [place] she needs to use back-up contraception to prevent pregnancy such as condoms as the dosage change can effect the oral contraceptive pills. She needs to the use the back-up contraceptive method for at least 4 weeks after any dosage change.   She should plan to follow-up as scheduled and reach out with any questions/concerns.

## 2022-08-22 NOTE — Progress Notes (Signed)
Called pt and let her be aware of provider's notes

## 2022-08-22 NOTE — Telephone Encounter (Signed)
Called pt and made her aware of provider's notes

## 2022-08-31 ENCOUNTER — Ambulatory Visit: Payer: BC Managed Care – PPO | Admitting: Nurse Practitioner

## 2022-08-31 ENCOUNTER — Telehealth: Payer: Self-pay | Admitting: Nurse Practitioner

## 2022-08-31 VITALS — BP 128/80 | HR 75 | Temp 97.7°F | Ht 71.0 in | Wt 299.5 lb

## 2022-08-31 DIAGNOSIS — R21 Rash and other nonspecific skin eruption: Secondary | ICD-10-CM

## 2022-08-31 DIAGNOSIS — B029 Zoster without complications: Secondary | ICD-10-CM | POA: Insufficient documentation

## 2022-08-31 DIAGNOSIS — E119 Type 2 diabetes mellitus without complications: Secondary | ICD-10-CM

## 2022-08-31 DIAGNOSIS — Z7984 Long term (current) use of oral hypoglycemic drugs: Secondary | ICD-10-CM

## 2022-08-31 MED ORDER — TRIAMCINOLONE ACETONIDE 0.1 % EX CREA
1.0000 | TOPICAL_CREAM | Freq: Two times a day (BID) | CUTANEOUS | 0 refills | Status: AC
Start: 2022-08-31 — End: ?

## 2022-08-31 MED ORDER — LANCET DEVICE MISC
1.0000 | Freq: Three times a day (TID) | 0 refills | Status: AC
Start: 2022-08-31 — End: 2022-09-30

## 2022-08-31 MED ORDER — VALACYCLOVIR HCL 1 G PO TABS
1000.0000 mg | ORAL_TABLET | Freq: Three times a day (TID) | ORAL | 0 refills | Status: AC
Start: 2022-08-31 — End: 2022-09-10

## 2022-08-31 MED ORDER — BLOOD GLUCOSE MONITORING SUPPL DEVI
1.0000 | Freq: Three times a day (TID) | 0 refills | Status: AC
Start: 2022-08-31 — End: ?

## 2022-08-31 MED ORDER — LANCETS MISC. MISC
1.0000 | Freq: Three times a day (TID) | 11 refills | Status: AC
Start: 2022-08-31 — End: 2022-09-30

## 2022-08-31 MED ORDER — BLOOD GLUCOSE TEST VI STRP
1.0000 | ORAL_STRIP | Freq: Three times a day (TID) | 11 refills | Status: AC
Start: 2022-08-31 — End: 2022-09-30

## 2022-08-31 NOTE — Assessment & Plan Note (Addendum)
Acute, etiology unclear Supervising physician consulted and she also assessed the rash.  Etiology remains unclear. It appears somewhat consistent with shingles.  Per shared decision making we will start patient on triamcinolone cream twice a day.  Patient will then reach out to her rheumatologist to discuss safety of taking a course of valacyclovir as well as continuing Humira in the event this is a shingles rash.  Depending on that discussion patient will take course of valacyclovir for 1 week. Refer to dermatology as well in case rash does not resolve for further evaluation. Patient told to call office if symptoms worsen.  She reports understanding.

## 2022-08-31 NOTE — Patient Instructions (Signed)
Rash: cause is not clear. Call your rheumatologist to ask about safety with use of Humira while taking valacyclovir (an antiviral medication) as well as whether taking the Humira could potentially make the rash worse if it is due to shingles. Apply the triamcinolone cream to area twice a day for up to 14 days. Allow the cream to fully dry/absorb before covering the area or laying down for bed at night. I will order referral to dermatology, if rash resolves before appointment you can cancel appointment if you want to.   Diabetes: continue current medications. I will send a glucometer prescription to your pharmacy try to check your blood sugar before breakfast once a day. Your goal sugar at that time of day is 80-130.    Reach out with any questions/concerns

## 2022-08-31 NOTE — Telephone Encounter (Signed)
Please request previous records from [facility]

## 2022-08-31 NOTE — Assessment & Plan Note (Signed)
Chronic, last A1c above goal Continue metformin 500 mg daily, continue Mounjaro 5 mg/week.  Glucometer ordered for patient to keep an eye on fasting blood sugars daily. Patient to follow-up in 2 months which point A1c will be due for recheck. For now patient to stay off of ACE ARB and statin therapy.  Consider checking urine for albuminuria next office visit.

## 2022-08-31 NOTE — Progress Notes (Signed)
Established Patient Office Visit  Subjective   Patient ID: XIOLA ZABLOCKI, female    DOB: December 19, 1985  Age: 37 y.o. MRN: [id number]  Chief Complaint  Patient presents with   Rash    Rash: Rash onset 1 week ago. Was working in yard prior to rash onset.  She reports a burning discomfort but not pain.  Located behind her knee.  Reports history of shingles.  Has rheumatoid arthritis and is on Humira.  Reports rash initially started with 2 bumps, but over the last week has progressed to multiple bumps.  Over the last couple days has not identified any additional progression.  Not currently weeping.  Has not used any over-the-counter treatment as of yet.  Type 2 diabetes: Last A1c 7.2.  Currently takes metformin 500 mg daily and is also on [place] tied 5 mg/week.  Tolerating both medications well.  Reports she does not have a glucometer available so she has not been monitoring blood sugars.  She continues on Sprintec for contraception.  Is not on ACE or ARB or statin therapy.  Last LDL was 62.    ROS: see HPI    Objective:     BP 128/80   Pulse 75   Temp 97.7 F (36.5 C) (Temporal)   Ht 5\' 11"  (1.803 m)   Wt 299 lb 8 oz (135.9 kg)   LMP 08/28/2022   SpO2 97%   BMI 41.77 kg/m  BP Readings from Last 3 Encounters:  08/31/22 128/80  06/15/22 136/84  10/06/21 132/74   Wt Readings from Last 3 Encounters:  08/31/22 299 lb 8 oz (135.9 kg)  06/15/22 (!) 309 lb 8 oz (140.4 kg)  03/23/21 (!) 313 lb 6 oz (142.1 kg)      Physical Exam Vitals reviewed.  Constitutional:      General: She is not in acute distress.    Appearance: Normal appearance.  HENT:     Head: Normocephalic and atraumatic.  Neck:     Vascular: No carotid bruit.  Cardiovascular:     Rate and Rhythm: Normal rate and regular rhythm.     Pulses: Normal pulses.     Heart sounds: Normal heart sounds.  Pulmonary:     Effort: Pulmonary effort is normal.     Breath sounds: Normal breath sounds.  Skin:    General:  Skin is warm and dry.       Neurological:     General: No focal deficit present.     Mental Status: She is alert and oriented to person, place, and time.  Psychiatric:        Mood and Affect: Mood normal.        Behavior: Behavior normal.        Judgment: Judgment normal.      No results found for any visits on 08/31/22.    The ASCVD Risk score (Arnett DK, et al., 2019) failed to calculate for the following reasons:   The 2019 ASCVD risk score is only valid for ages 72 to 43    Assessment & Plan:   Problem List Items Addressed This Visit       Endocrine   Type 2 diabetes mellitus without complication, without long-term current use of insulin (HCC)    Chronic, last A1c above goal Continue metformin 500 mg daily, continue Mounjaro 5 mg/week.  Glucometer ordered for patient to keep an eye on fasting blood sugars daily. Patient to follow-up in 2 months which point A1c will be due  for recheck. For now patient to stay off of ACE ARB and statin therapy.  Consider checking urine for albuminuria next office visit.      Relevant Medications   Blood Glucose Monitoring Suppl DEVI   Glucose Blood (BLOOD GLUCOSE TEST STRIPS) STRP   Lancet Device MISC   Lancets Misc. MISC     Musculoskeletal and Integument   Rash - Primary    Acute, etiology unclear Supervising physician consulted and she also assessed the rash.  Etiology remains unclear. It appears somewhat consistent with shingles.  Per shared decision making we will start patient on triamcinolone cream twice a day.  Patient will then reach out to her rheumatologist to discuss safety of taking a course of valacyclovir as well as continuing Humira in the event this is a shingles rash.  Depending on that discussion patient will take course of valacyclovir for 1 week. Refer to dermatology as well in case rash does not resolve for further evaluation. Patient told to call office if symptoms worsen.  She reports understanding.       Relevant Medications   valACYclovir (VALTREX) 1000 MG tablet   triamcinolone cream (KENALOG) 0.1 %   Other Relevant Orders   Ambulatory referral to Dermatology    Return in about 2 months (around 10/31/2022) for F/U with Nioma Mccubbins in person or virtual.    Elenore Paddy, NP

## 2022-09-03 ENCOUNTER — Other Ambulatory Visit: Payer: Self-pay | Admitting: Nurse Practitioner

## 2022-09-03 DIAGNOSIS — E119 Type 2 diabetes mellitus without complications: Secondary | ICD-10-CM

## 2022-09-04 NOTE — Telephone Encounter (Signed)
Faxed medical release to [facility].Marland KitchenRaechel Chute

## 2022-09-05 DIAGNOSIS — R5382 Chronic fatigue, unspecified: Secondary | ICD-10-CM | POA: Diagnosis not present

## 2022-09-05 DIAGNOSIS — M0609 Rheumatoid arthritis without rheumatoid factor, multiple sites: Secondary | ICD-10-CM | POA: Diagnosis not present

## 2022-09-05 DIAGNOSIS — M25531 Pain in right wrist: Secondary | ICD-10-CM | POA: Diagnosis not present

## 2022-11-02 ENCOUNTER — Ambulatory Visit: Payer: BC Managed Care – PPO | Admitting: Nurse Practitioner

## 2022-11-02 ENCOUNTER — Other Ambulatory Visit (HOSPITAL_COMMUNITY): Payer: Self-pay

## 2022-11-02 VITALS — BP 126/84 | HR 83 | Temp 98.1°F | Ht 71.0 in | Wt 298.4 lb

## 2022-11-02 DIAGNOSIS — E66813 Obesity, class 3: Secondary | ICD-10-CM

## 2022-11-02 DIAGNOSIS — E119 Type 2 diabetes mellitus without complications: Secondary | ICD-10-CM

## 2022-11-02 DIAGNOSIS — Z6841 Body Mass Index (BMI) 40.0 and over, adult: Secondary | ICD-10-CM | POA: Diagnosis not present

## 2022-11-02 DIAGNOSIS — Z7984 Long term (current) use of oral hypoglycemic drugs: Secondary | ICD-10-CM

## 2022-11-02 LAB — BASIC METABOLIC PANEL
BUN: 11 mg/dL (ref 6–23)
CO2: 25 meq/L (ref 19–32)
Calcium: 9.2 mg/dL (ref 8.4–10.5)
Chloride: 104 meq/L (ref 96–112)
Creatinine, Ser: 0.6 mg/dL (ref 0.40–1.20)
GFR: 116.15 mL/min (ref >60.00)
Glucose, Bld: 100 mg/dL — ABNORMAL HIGH (ref 70–99)
Potassium: 4.3 meq/L (ref 3.5–5.1)
Sodium: 139 meq/L (ref 135–145)

## 2022-11-02 LAB — MICROALBUMIN / CREATININE URINE RATIO
Creatinine,U: 62 mg/dL
Microalb Creat Ratio: 3.8 mg/g (ref 0.0–30.0)
Microalb, Ur: 2.3 mg/dL — ABNORMAL HIGH (ref 0.0–1.9)

## 2022-11-02 LAB — HEMOGLOBIN A1C: Hgb A1c MFr Bld: 6 % (ref 4.6–6.5)

## 2022-11-02 MED ORDER — TIRZEPATIDE 7.5 MG/0.5ML ~~LOC~~ SOAJ
7.5000 mg | SUBCUTANEOUS | 2 refills | Status: DC
Start: 2022-11-02 — End: 2023-10-23
  Filled 2022-11-02: qty 6, 84d supply, fill #0
  Filled 2023-02-18: qty 2, 28d supply, fill #1
  Filled 2023-04-06: qty 2, 28d supply, fill #2
  Filled 2023-05-04: qty 2, 28d supply, fill #3
  Filled 2023-06-01: qty 2, 28d supply, fill #4
  Filled 2023-07-19: qty 2, 28d supply, fill #5
  Filled 2023-08-14: qty 2, 28d supply, fill #6

## 2022-11-02 NOTE — Assessment & Plan Note (Signed)
Chronic Continue following healthy diet and exercising regularly Also will increase Mounjaro to 7.5 mg/week

## 2022-11-02 NOTE — Progress Notes (Signed)
Established Patient Office Visit  Subjective   Patient ID: Julie Keller, female    DOB: 05-Jun-1985  Age: 37 y.o. MRN: [id number]  Chief Complaint  Patient presents with   Medical Management of Chronic Issues    2 month follow up on previous issue, no flu shot     T2DM/Obesity: Due for A1c check today, last one 7.2.  Currently on Mounjaro 5 mg/week and metformin 500 mg daily.  Tolerating both well.  Has lost about 10 pounds over the last 5 months.  Current BMI 41.61.    Review of Systems  Respiratory:  Negative for shortness of breath.   Cardiovascular:  Negative for chest pain.      Objective:     BP 126/84   Pulse 83   Temp 98.1 F (36.7 C) (Temporal)   Ht 5\' 11"  (1.803 m)   Wt 298 lb 6 oz (135.3 kg)   LMP 10/28/2022   SpO2 96%   BMI 41.61 kg/m  BP Readings from Last 3 Encounters:  11/02/22 126/84  08/31/22 128/80  06/15/22 136/84   Wt Readings from Last 3 Encounters:  11/02/22 298 lb 6 oz (135.3 kg)  08/31/22 299 lb 8 oz (135.9 kg)  06/15/22 (!) 309 lb 8 oz (140.4 kg)      Physical Exam Vitals reviewed.  Constitutional:      General: She is not in acute distress.    Appearance: Normal appearance.  HENT:     Head: Normocephalic and atraumatic.  Cardiovascular:     Rate and Rhythm: Normal rate and regular rhythm.     Pulses: Normal pulses.     Heart sounds: Normal heart sounds.  Pulmonary:     Effort: Pulmonary effort is normal.     Breath sounds: Normal breath sounds.  Skin:    General: Skin is warm and dry.  Neurological:     General: No focal deficit present.     Mental Status: She is alert and oriented to person, place, and time.  Psychiatric:        Mood and Affect: Mood normal.        Behavior: Behavior normal.        Judgment: Judgment normal.      No results found for any visits on 11/02/22.    The ASCVD Risk score (Arnett DK, et al., 2019) failed to calculate for the following reasons:   The 2019 ASCVD risk score is only  valid for ages 32 to 22    Assessment & Plan:   Problem List Items Addressed This Visit       Endocrine   Type 2 diabetes mellitus without complication, without long-term current use of insulin (HCC) - Primary    Chronic Check A1c as well as urine for albuminuria today Continue metformin 500 mg daily Increase Mounjaro to 7.5 mg/week Per shared decision making patient would like to continue staying off of ACE/ARB therapy as well as statin therapy.  We did discuss benefits and risks of taking medication versus not taking medication and continue to monitor cholesterol levels as well as kidney function closely.  She reports understanding.  Patient declined flu shot today, stating she will get at her job.      Relevant Medications   tirzepatide (MOUNJARO) 7.5 MG/0.5ML Pen   Other Relevant Orders   Hemoglobin A1c   Microalbumin / creatinine urine ratio   Basic metabolic panel     Other   Class 3 severe obesity with serious  comorbidity and body mass index (BMI) of 40.0 to 44.9 in adult [facility])    Chronic Continue following healthy diet and exercising regularly Also will increase Mounjaro to 7.5 mg/week      Relevant Medications   tirzepatide (MOUNJARO) 7.5 MG/0.5ML Pen    Return in about 3 months (around 02/02/2023).    Elenore Paddy, NP

## 2022-11-02 NOTE — Assessment & Plan Note (Signed)
Chronic Check A1c as well as urine for albuminuria today Continue metformin 500 mg daily Increase Mounjaro to 7.5 mg/week Per shared decision making patient would like to continue staying off of ACE/ARB therapy as well as statin therapy.  We did discuss benefits and risks of taking medication versus not taking medication and continue to monitor cholesterol levels as well as kidney function closely.  She reports understanding.  Patient declined flu shot today, stating she will get at her job.

## 2022-11-03 ENCOUNTER — Other Ambulatory Visit (HOSPITAL_COMMUNITY): Payer: Self-pay

## 2022-12-03 ENCOUNTER — Other Ambulatory Visit: Payer: Self-pay | Admitting: Nurse Practitioner

## 2022-12-03 DIAGNOSIS — E119 Type 2 diabetes mellitus without complications: Secondary | ICD-10-CM

## 2022-12-06 ENCOUNTER — Encounter: Payer: Self-pay | Admitting: Nurse Practitioner

## 2022-12-06 NOTE — Telephone Encounter (Signed)
Care team updated and letter sent for eye exam notes.

## 2022-12-11 DIAGNOSIS — Z79899 Other long term (current) drug therapy: Secondary | ICD-10-CM | POA: Diagnosis not present

## 2022-12-11 DIAGNOSIS — R5382 Chronic fatigue, unspecified: Secondary | ICD-10-CM | POA: Diagnosis not present

## 2022-12-11 DIAGNOSIS — M0609 Rheumatoid arthritis without rheumatoid factor, multiple sites: Secondary | ICD-10-CM | POA: Diagnosis not present

## 2023-02-07 ENCOUNTER — Ambulatory Visit: Payer: BC Managed Care – PPO | Admitting: Nurse Practitioner

## 2023-02-07 VITALS — BP 120/82 | HR 86 | Temp 97.7°F | Ht 71.0 in | Wt 288.5 lb

## 2023-02-07 DIAGNOSIS — E66813 Obesity, class 3: Secondary | ICD-10-CM

## 2023-02-07 DIAGNOSIS — Z6841 Body Mass Index (BMI) 40.0 and over, adult: Secondary | ICD-10-CM

## 2023-02-07 DIAGNOSIS — Z23 Encounter for immunization: Secondary | ICD-10-CM

## 2023-02-07 DIAGNOSIS — E119 Type 2 diabetes mellitus without complications: Secondary | ICD-10-CM | POA: Diagnosis not present

## 2023-02-07 DIAGNOSIS — Z7984 Long term (current) use of oral hypoglycemic drugs: Secondary | ICD-10-CM

## 2023-02-07 LAB — POCT GLYCOSYLATED HEMOGLOBIN (HGB A1C): Hemoglobin A1C: 5.9 % — AB (ref 4.0–5.6)

## 2023-02-07 NOTE — Progress Notes (Signed)
Established Patient Office Visit  Subjective   Patient ID: Julie Keller, female    DOB: 05-22-1985  Age: 38 y.o. MRN: [id number]  Chief Complaint  Patient presents with   Diabetes    T2DM: A1C today 5.9. Continues on mounjaro 7.5mg /week and metformin.  Tolerating medications well.  Has decided against starting ACE ARB or statin therapy in the past, not currently on these medications right now.  Last LDL 62.  Up-to-date with flu shot this year, has not had pneumonia vaccination previously.  Due for foot exam.  Due for diabetic eye exam, reports having appointment scheduled for February for this.     Review of Systems  Respiratory:  Negative for shortness of breath.   Cardiovascular:  Negative for chest pain.      Objective:     BP 120/82   Pulse 86   Temp 97.7 F (36.5 C) (Temporal)   Ht 5\' 11"  (1.803 m)   Wt 288 lb 8 oz (130.9 kg)   LMP 01/07/2023   SpO2 97%   BMI 40.24 kg/m  BP Readings from Last 3 Encounters:  02/07/23 120/82  11/02/22 126/84  08/31/22 128/80   Wt Readings from Last 3 Encounters:  02/07/23 288 lb 8 oz (130.9 kg)  11/02/22 298 lb 6 oz (135.3 kg)  08/31/22 299 lb 8 oz (135.9 kg)     Diabetic Foot Exam - Simple   Simple Foot Form Diabetic Foot exam was performed with the following findings: Yes 02/07/2023  8:30 AM  Visual Inspection No deformities, no ulcerations, no other skin breakdown bilaterally: Yes Sensation Testing Intact to touch and monofilament testing bilaterally: Yes Pulse Check Posterior Tibialis and Dorsalis pulse intact bilaterally: Yes Comments      Physical Exam Vitals reviewed.  Constitutional:      General: She is not in acute distress.    Appearance: Normal appearance.  HENT:     Head: Normocephalic and atraumatic.  Neck:     Vascular: No carotid bruit.  Cardiovascular:     Rate and Rhythm: Normal rate and regular rhythm.     Pulses: Normal pulses.     Heart sounds: Normal heart sounds.  Pulmonary:      Effort: Pulmonary effort is normal.     Breath sounds: Normal breath sounds.  Skin:    General: Skin is warm and dry.  Neurological:     General: No focal deficit present.     Mental Status: She is alert and oriented to person, place, and time.  Psychiatric:        Mood and Affect: Mood normal.        Behavior: Behavior normal.        Judgment: Judgment normal.      Results for orders placed or performed in visit on 02/07/23  POCT HgB A1C  Result Value Ref Range   Hemoglobin A1C 5.9 (A) 4.0 - 5.6 %   HbA1c POC (<> result, manual entry)     HbA1c, POC (prediabetic range)     HbA1c, POC (controlled diabetic range)        The ASCVD Risk score (Arnett DK, et al., 2019) failed to calculate for the following reasons:   The 2019 ASCVD risk score is only valid for ages 49 to 58    Assessment & Plan:   Problem List Items Addressed This Visit       Endocrine   Type 2 diabetes mellitus without complication, without long-term current use of insulin (HCC) -  Primary   Chronic, stable A1c 5.9 Continue Mounjaro 7.5 mg weekly as well as metformin 500 mg daily. Foot exam completed today Patient encouraged to follow-up with eye doctor for diabetic eye exam as scheduled Per patient preference continue off ACE/ARB and statin for now. She continues on oral contraceptive pills for contraception, she was again encouraged to reach out to the office if she misses a period or has unplanned pregnancy or if she decides she wants to plan another pregnancy regarding medication adjustments.  She reports understanding. Prevnar 20 administered today, VIS provided Follow-up in 9 months or sooner as needed.      Relevant Orders   POCT HgB A1C (Completed)     Other   Class 3 severe obesity with serious comorbidity and body mass index (BMI) of 40.0 to 44.9 in adult [facility])   Chronic Patient encouraged to follow healthy diet and continue Mounjaro 7.5 mg/week        Return in about 9 months (around  11/07/2023) for CPE with Maralyn Sago.    Elenore Paddy, NP

## 2023-02-07 NOTE — Addendum Note (Signed)
Addended by: Cathleen Fears, Kearston Putman P on: 02/07/2023 08:58 AM   Modules accepted: Orders

## 2023-02-07 NOTE — Assessment & Plan Note (Signed)
Chronic Patient encouraged to follow healthy diet and continue Mounjaro 7.5 mg/week

## 2023-02-07 NOTE — Assessment & Plan Note (Addendum)
Chronic, stable A1c 5.9 Continue Mounjaro 7.5 mg weekly as well as metformin 500 mg daily. Foot exam completed today Patient encouraged to follow-up with eye doctor for diabetic eye exam as scheduled Per patient preference continue off ACE/ARB and statin for now. She continues on oral contraceptive pills for contraception, she was again encouraged to reach out to the office if she misses a period or has unplanned pregnancy or if she decides she wants to plan another pregnancy regarding medication adjustments.  She reports understanding. Prevnar 20 administered today, VIS provided Follow-up in 9 months or sooner as needed.

## 2023-03-01 ENCOUNTER — Other Ambulatory Visit: Payer: Self-pay

## 2023-03-06 ENCOUNTER — Encounter (HOSPITAL_COMMUNITY): Payer: Self-pay

## 2023-03-07 ENCOUNTER — Encounter: Payer: Self-pay | Admitting: Nurse Practitioner

## 2023-03-07 ENCOUNTER — Telehealth: Payer: Self-pay

## 2023-03-07 ENCOUNTER — Other Ambulatory Visit (HOSPITAL_COMMUNITY): Payer: Self-pay

## 2023-03-07 NOTE — Telephone Encounter (Signed)
Pharmacy Patient Advocate Encounter   Received notification from Patient Advice Request messages that prior authorization for Mounjaro 7.5mg /0.74ml is required/requested.   Insurance verification completed.   The patient is insured through [facility] .   Per test claim: PA required; PA submitted to above mentioned insurance via Prompt PA Key/confirmation #/EOC [id number] Status is pending

## 2023-03-08 ENCOUNTER — Other Ambulatory Visit: Payer: Self-pay | Admitting: Nurse Practitioner

## 2023-03-08 DIAGNOSIS — E119 Type 2 diabetes mellitus without complications: Secondary | ICD-10-CM

## 2023-03-11 ENCOUNTER — Other Ambulatory Visit (HOSPITAL_COMMUNITY): Payer: Self-pay

## 2023-03-11 NOTE — Telephone Encounter (Signed)
 Pharmacy Patient Advocate Encounter  Received notification from [employer] that Prior Authorization for [facility] 7.5MG /0.5ML has been APPROVED from 03/07/2023 to 03/05/2024. Unable to obtain price due to refill too soon rejection, last fill date 03/07/2023 next available fill date03/06/2023   PA #/Case ID/Reference #: [facility] NUMBER [id number]

## 2023-04-12 ENCOUNTER — Encounter: Payer: Self-pay | Admitting: Nurse Practitioner

## 2023-05-04 ENCOUNTER — Other Ambulatory Visit (HOSPITAL_COMMUNITY): Payer: Self-pay

## 2023-05-06 ENCOUNTER — Other Ambulatory Visit: Payer: Self-pay

## 2023-06-03 ENCOUNTER — Other Ambulatory Visit (HOSPITAL_COMMUNITY): Payer: Self-pay

## 2023-07-19 ENCOUNTER — Other Ambulatory Visit (HOSPITAL_COMMUNITY): Payer: Self-pay

## 2023-08-14 ENCOUNTER — Other Ambulatory Visit: Payer: Self-pay

## 2023-10-23 ENCOUNTER — Other Ambulatory Visit: Payer: Self-pay | Admitting: Nurse Practitioner

## 2023-10-23 DIAGNOSIS — E119 Type 2 diabetes mellitus without complications: Secondary | ICD-10-CM

## 2023-10-24 ENCOUNTER — Other Ambulatory Visit: Payer: Self-pay

## 2023-10-24 ENCOUNTER — Other Ambulatory Visit (HOSPITAL_COMMUNITY): Payer: Self-pay

## 2023-10-24 MED ORDER — MOUNJARO 7.5 MG/0.5ML ~~LOC~~ SOAJ
7.5000 mg | SUBCUTANEOUS | 2 refills | Status: DC
  Filled 2023-10-24: qty 6, 84d supply, fill #0

## 2023-11-07 ENCOUNTER — Ambulatory Visit: Payer: BC Managed Care – PPO | Admitting: Nurse Practitioner

## 2023-12-26 ENCOUNTER — Ambulatory Visit: Admitting: Nurse Practitioner

## 2023-12-26 ENCOUNTER — Other Ambulatory Visit: Payer: Self-pay

## 2023-12-26 ENCOUNTER — Other Ambulatory Visit (HOSPITAL_COMMUNITY): Payer: Self-pay

## 2023-12-26 VITALS — BP 128/80 | HR 90 | Temp 98.1°F | Ht 71.0 in | Wt 284.0 lb

## 2023-12-26 DIAGNOSIS — E66813 Obesity, class 3: Secondary | ICD-10-CM

## 2023-12-26 DIAGNOSIS — E119 Type 2 diabetes mellitus without complications: Secondary | ICD-10-CM

## 2023-12-26 DIAGNOSIS — E1169 Type 2 diabetes mellitus with other specified complication: Secondary | ICD-10-CM

## 2023-12-26 LAB — COMPREHENSIVE METABOLIC PANEL WITH GFR
ALT: 14 U/L (ref 0–35)
AST: 14 U/L (ref 0–37)
Albumin: 4.2 g/dL (ref 3.5–5.2)
Alkaline Phosphatase: 45 U/L (ref 39–117)
BUN: 12 mg/dL (ref 6–23)
CO2: 26 meq/L (ref 19–32)
Calcium: 9.6 mg/dL (ref 8.4–10.5)
Chloride: 103 meq/L (ref 96–112)
Creatinine, Ser: 0.67 mg/dL (ref 0.40–1.20)
GFR: 112.2 mL/min (ref >60.00)
Glucose, Bld: 89 mg/dL (ref 70–99)
Potassium: 4.1 meq/L (ref 3.5–5.1)
Sodium: 138 meq/L (ref 135–145)
Total Bilirubin: 0.4 mg/dL (ref 0.2–1.2)
Total Protein: 7.2 g/dL (ref 6.0–8.3)

## 2023-12-26 LAB — LIPID PANEL
Cholesterol: 131 mg/dL (ref 0–200)
HDL: 45.2 mg/dL (ref >39.00)
LDL Cholesterol: 62 mg/dL (ref 0–99)
NonHDL: 85.52
Total CHOL/HDL Ratio: 3
Triglycerides: 117 mg/dL (ref 0.0–149.0)
VLDL: 23.4 mg/dL (ref 0.0–40.0)

## 2023-12-26 LAB — MICROALBUMIN / CREATININE URINE RATIO
Creatinine,U: 121.8 mg/dL
Microalb Creat Ratio: 30.7 mg/g — ABNORMAL HIGH (ref 0.0–30.0)
Microalb, Ur: 3.7 mg/dL — ABNORMAL HIGH (ref 0.0–1.9)

## 2023-12-26 LAB — CBC
HCT: 40.9 % (ref 36.0–46.0)
Hemoglobin: 14 g/dL (ref 12.0–15.0)
MCHC: 34.2 g/dL (ref 30.0–36.0)
MCV: 83.9 fl (ref 78.0–100.0)
Platelets: 323 K/uL (ref 150.0–400.0)
RBC: 4.88 Mil/uL (ref 3.87–5.11)
RDW: 13.4 % (ref 11.5–15.5)
WBC: 9.2 K/uL (ref 4.0–10.5)

## 2023-12-26 LAB — POCT GLYCOSYLATED HEMOGLOBIN (HGB A1C): Hemoglobin A1C: 5.9 % — AB (ref 4.0–5.6)

## 2023-12-26 LAB — HCG, SERUM, QUALITATIVE: Preg, Serum: NEGATIVE

## 2023-12-26 LAB — TSH: TSH: 3.83 u[IU]/mL (ref 0.35–5.50)

## 2023-12-26 MED ORDER — TIRZEPATIDE 10 MG/0.5ML ~~LOC~~ SOAJ
10.0000 mg | SUBCUTANEOUS | 1 refills | Status: AC
  Filled 2023-12-26: qty 6, 84d supply, fill #0

## 2023-12-26 NOTE — Progress Notes (Signed)
 Established Patient Office Visit  Subjective   Patient ID: Julie Keller, female    DOB: April 02, 1985  Age: 38 y.o. MRN: [id number]  Chief Complaint  Patient presents with   Diabetes    Discussed the use of AI scribe software for clinical note transcription with the patient, who gave verbal consent to proceed.  History of Present Illness Julie Keller is a 38 year old female with type 2 diabetes who presents for follow-up of her A1c levels.  Glycemic control - Diagnosed with type 2 diabetes in 2023 - A1c peaked at 7.2% in May 2024 - Current A1c is 5.9% - No episodes of hypoglycemia - Does not check blood glucose at home  Antihyperglycemic therapy - Taking Mounjaro  7.5 mg once weekly - Taking metformin  500 mg once daily - No medication side effects  Body weight - Current weight is 284 pounds  - Ongoing gradual weight loss - Weight was 309 in 05/2022 with BMI of 43.17. Current BMI 39.61  HLD - History of elevated triglycerides. Last level was 172      Review of Systems  Respiratory:  Negative for shortness of breath.   Cardiovascular:  Negative for chest pain.      Objective:     BP 128/80   Pulse 90   Temp 98.1 F (36.7 C) (Oral)   Ht 5' 11 (1.803 m)   Wt 284 lb (128.8 kg)   SpO2 98%   BMI 39.61 kg/m  BP Readings from Last 3 Encounters:  12/26/23 128/80  02/07/23 120/82  11/02/22 126/84   Wt Readings from Last 3 Encounters:  12/26/23 284 lb (128.8 kg)  02/07/23 288 lb 8 oz (130.9 kg)  11/02/22 298 lb 6 oz (135.3 kg)      Physical Exam Vitals reviewed.  Constitutional:      General: She is not in acute distress.    Appearance: Normal appearance.  HENT:     Head: Normocephalic and atraumatic.  Cardiovascular:     Rate and Rhythm: Normal rate and regular rhythm.     Pulses: Normal pulses.     Heart sounds: Normal heart sounds.  Pulmonary:     Effort: Pulmonary effort is normal.     Breath sounds: Normal breath sounds.  Skin:     General: Skin is warm and dry.  Neurological:     General: No focal deficit present.     Mental Status: She is alert and oriented to person, place, and time.  Psychiatric:        Mood and Affect: Mood normal.        Behavior: Behavior normal.        Judgment: Judgment normal.      Results for orders placed or performed in visit on 12/26/23  POCT HgB A1C  Result Value Ref Range   Hemoglobin A1C 5.9 (A) 4.0 - 5.6 %   HbA1c POC (<> result, manual entry)     HbA1c, POC (prediabetic range)     HbA1c, POC (controlled diabetic range)        The ASCVD Risk score (Arnett DK, et al., 2019) failed to calculate for the following reasons:   The 2019 ASCVD risk score is only valid for ages 72 to 29    Assessment & Plan:   Problem List Items Addressed This Visit       Endocrine   Type 2 diabetes mellitus with hyperlipidemia (HCC) - Primary   Type 2 diabetes mellitus without complication Type  2 diabetes is well-controlled with A1c of 5.9. No hypoglycemic episodes reported. - Continue Mounjaro  7.5 mg weekly until current supply is finished, then increase to 10 mg weekly. - Use backup contraception for the first four weeks after increasing Mounjaro  dosage. - Monitor for severe side effects such as nausea, vomiting, or gastrointestinal issues after dosage increase. - Ordered urine test for proteinuria. - Ordered blood draw for comprehensive lab work. - Advised scheduling yearly diabetic eye exam.      Relevant Medications   tirzepatide  (MOUNJARO ) 10 MG/0.5ML Pen     Other   Class 3 severe obesity with serious comorbidity and body mass index (BMI) of 40.0 to 44.9 in adult [facility])   Class 3 severe obesity (BMI 40.0-44.9) Class 3 severe obesity with gradual weight loss, currently 284 lbs (05/2022 weight 309lbs, BMI 43.17). Mounjaro  aiding in weight loss. - Continue Mounjaro  7.5 mg weekly until current supply is finished, then increase to 10 mg weekly to aid further weight loss.       Relevant Medications   tirzepatide  (MOUNJARO ) 10 MG/0.5ML Pen   Other Relevant Orders   POCT HgB A1C (Completed)   CBC   Comprehensive metabolic panel with GFR   Lipid panel   TSH   Microalbumin / creatinine urine ratio   hCG, serum, qualitative   Assessment and Plan Assessment & Plan Type 2 diabetes mellitus without complication Type 2 diabetes is well-controlled with A1c of 5.9. No hypoglycemic episodes reported. - Continue Mounjaro  7.5 mg weekly until current supply is finished, then increase to 10 mg weekly. - Use backup contraception for the first four weeks after increasing Mounjaro  dosage. - Monitor for severe side effects such as nausea, vomiting, or gastrointestinal issues after dosage increase. - Ordered urine test for proteinuria. - Ordered blood draw for comprehensive lab work. - Advised scheduling yearly diabetic eye exam.  Class 3 severe obesity (BMI 40.0-44.9) Class 3 severe obesity with gradual weight loss, currently 284 lbs (05/2022 weight 309lbs, BMI 43.17). Mounjaro  aiding in weight loss. - Continue Mounjaro  7.5 mg weekly until current supply is finished, then increase to 10 mg weekly to aid further weight loss.    Return in about 6 months (around 06/25/2024) for CPE with [facility].    Julie FORBES Pereyra, NP

## 2023-12-26 NOTE — Assessment & Plan Note (Signed)
 Type 2 diabetes mellitus without complication Type 2 diabetes is well-controlled with A1c of 5.9. No hypoglycemic episodes reported. - Continue Mounjaro  7.5 mg weekly until current supply is finished, then increase to 10 mg weekly. - Use backup contraception for the first four weeks after increasing Mounjaro  dosage. - Monitor for severe side effects such as nausea, vomiting, or gastrointestinal issues after dosage increase. - Ordered urine test for proteinuria. - Ordered blood draw for comprehensive lab work. - Advised scheduling yearly diabetic eye exam.

## 2023-12-26 NOTE — Assessment & Plan Note (Signed)
 Class 3 severe obesity (BMI 40.0-44.9) Class 3 severe obesity with gradual weight loss, currently 284 lbs (05/2022 weight 309lbs, BMI 43.17). Mounjaro  aiding in weight loss. - Continue Mounjaro  7.5 mg weekly until current supply is finished, then increase to 10 mg weekly to aid further weight loss.

## 2023-12-27 ENCOUNTER — Ambulatory Visit: Payer: Self-pay | Admitting: Nurse Practitioner

## 2024-06-26 ENCOUNTER — Encounter: Admitting: Nurse Practitioner
